# Patient Record
Sex: Female | Born: 1970 | Race: Black or African American | Hispanic: No | Marital: Single | Smoking: Never smoker
Health system: Southern US, Community
[De-identification: ages and names within clinical notes are randomized; demographics above are authoritative.]

## PROBLEM LIST (undated history)

## (undated) DIAGNOSIS — G43909 Migraine, unspecified, not intractable, without status migrainosus: Secondary | ICD-10-CM

## (undated) DIAGNOSIS — E119 Type 2 diabetes mellitus without complications: Secondary | ICD-10-CM

## (undated) DIAGNOSIS — I10 Essential (primary) hypertension: Secondary | ICD-10-CM

## (undated) HISTORY — PX: OTHER SURGICAL HISTORY: SHX169

## (undated) HISTORY — PX: CERVICAL FUSION: SHX112

## (undated) HISTORY — PX: CHOLECYSTECTOMY: SHX55

---

## 2018-11-10 HISTORY — PX: CERVICAL FUSION: SHX112

## 2020-10-18 ENCOUNTER — Encounter (HOSPITAL_COMMUNITY): Payer: Self-pay | Admitting: Emergency Medicine

## 2020-10-18 ENCOUNTER — Ambulatory Visit (HOSPITAL_COMMUNITY)
Admission: EM | Admit: 2020-10-18 | Discharge: 2020-10-18 | Disposition: A | Discharge status: Home / Self Care | Payer: 59

## 2020-10-18 ENCOUNTER — Other Ambulatory Visit: Payer: Self-pay

## 2020-10-18 DIAGNOSIS — N760 Acute vaginitis: Secondary | ICD-10-CM

## 2020-10-18 HISTORY — DX: Essential (primary) hypertension: I10

## 2020-10-18 MED ORDER — FLUCONAZOLE 150 MG PO TABS: 150.0000 mg | ORAL_TABLET | Freq: Every day | ORAL | 0 refills | Status: DC

## 2020-10-18 NOTE — ED Triage Notes (Signed)
Pt presents with possible yeast infection. C/o vaginal itching, denies discharge, dysuria, or urinary frequency.

## 2020-10-18 NOTE — Discharge Instructions (Addendum)
Diflucan as prescribed for yeast infection.  You can take 1 tab now and then 1 tab in 3 days if still having symptoms. Follow up as needed for continued or worsening symptoms

## 2020-10-19 NOTE — ED Provider Notes (Signed)
Renaldo Fiddler    CSN: [id number] Arrival date & time: 10/18/20  1221      History   Chief Complaint Chief Complaint  Patient presents with  . Vaginitis    HPI Orville Widmann is a 49 y.o. female.   Patient is a 49 year old female presents today with vaginal itching, irritation with thick discharge.  Symptoms have been constant for the past couple days.  Concern for yeast infection.  History of similar.  Denies any urinary symptoms to include dysuria, hematuria or urinary frequency.  Denies abdominal pain or fevers.     Past Medical History:  Diagnosis Date  . Hypertension     There are no problems to display for this patient.   Past Surgical History:  Procedure Laterality Date  . CERVICAL FUSION    . gallstone surgery      OB History   No obstetric history on file.      Home Medications    Prior to Admission medications   Medication Sig Start Date End Date Taking? Authorizing Provider  lisinopril-hydrochlorothiazide (ZESTORETIC) 20-25 MG tablet Take by mouth. 12/06/19  Yes [provider]  fluconazole (DIFLUCAN) 150 MG tablet Take 1 tablet (150 mg total) by mouth daily. 10/18/20   Janace Aris, NP    Family History Family History  Problem Relation Age of Onset  . Cancer Mother        Cervical     Social History Social History   Tobacco Use  . Smoking status: Never Smoker  . Smokeless tobacco: Never Used  Substance Use Topics  . Alcohol use: Yes  . Drug use: Never     Allergies   Patient has no allergy information on record.   Review of Systems Review of Systems   Physical Exam Triage Vital Signs ED Triage Vitals  Enc Vitals Group     BP 10/18/20 1243 129/79     Pulse Rate 10/18/20 1243 88     Resp 10/18/20 1243 17     Temp 10/18/20 1243 99 F (37.2 C)     Temp Source 10/18/20 1243 Oral     SpO2 10/18/20 1243 97 %     Weight --      Height --      Head Circumference --      Peak Flow --      Pain Score 10/18/20  1239 0     Pain Loc --      Pain Edu? --      Excl. in GC? --    No data found.  Updated Vital Signs BP 129/79 (BP Location: Right Arm)   Pulse 88   Temp 99 F (37.2 C) (Oral)   Resp 17   LMP  (Within Weeks)   SpO2 97%   Visual Acuity Right Eye Distance:   Left Eye Distance:   Bilateral Distance:    Right Eye Near:   Left Eye Near:    Bilateral Near:     Physical Exam Vitals and nursing note reviewed.  Constitutional:      General: She is not in acute distress.    Appearance: Normal appearance. She is not ill-appearing, toxic-appearing or diaphoretic.  HENT:     Head: Normocephalic.     Nose: Nose normal.  Eyes:     Conjunctiva/sclera: Conjunctivae normal.  Pulmonary:     Effort: Pulmonary effort is normal.  Musculoskeletal:        General: Normal range of motion.  Cervical back: Normal range of motion.  Skin:    General: Skin is warm and dry.     Findings: No rash.  Neurological:     Mental Status: She is alert.  Psychiatric:        Mood and Affect: Mood normal.      UC Treatments / Results  Labs (all labs ordered are listed, but only abnormal results are displayed) Labs Reviewed - No data to display  EKG   Radiology No results found.  Procedures Procedures (including critical care time)  Medications Ordered in UC Medications - No data to display  Initial Impression / Assessment and Plan / UC Course  I have reviewed the triage vital signs and the nursing notes.  Pertinent labs & imaging results that were available during my care of the patient were reviewed by me and considered in my medical decision making (see chart for details).     Vaginitis Diflucan as prescribed for yeast infection. Follow up as needed for continued or worsening symptoms  Final Clinical Impressions(s) / UC Diagnoses   Final diagnoses:  Vaginitis and vulvovaginitis     Discharge Instructions     Diflucan as prescribed for yeast infection.  You can take 1  tab now and then 1 tab in 3 days if still having symptoms. Follow up as needed for continued or worsening symptoms     ED Prescriptions    Medication Sig Dispense Auth. Provider   fluconazole (DIFLUCAN) 150 MG tablet Take 1 tablet (150 mg total) by mouth daily. 2 tablet Dahlia Byes A, NP     PDMP not reviewed this encounter.   Janace Aris, NP 10/19/20 [phone]

## 2021-03-01 ENCOUNTER — Other Ambulatory Visit: Payer: Self-pay

## 2021-03-01 ENCOUNTER — Emergency Department (HOSPITAL_COMMUNITY)
Admission: EM | Admit: 2021-03-01 | Discharge: 2021-03-02 | Disposition: A | Discharge status: Home / Self Care | Payer: 59 | Attending: Emergency Medicine | Admitting: Emergency Medicine

## 2021-03-01 ENCOUNTER — Encounter (HOSPITAL_COMMUNITY): Payer: Self-pay

## 2021-03-01 ENCOUNTER — Emergency Department (HOSPITAL_COMMUNITY): Payer: 59

## 2021-03-01 ENCOUNTER — Encounter (HOSPITAL_COMMUNITY): Payer: Self-pay | Admitting: Emergency Medicine

## 2021-03-01 ENCOUNTER — Ambulatory Visit (HOSPITAL_COMMUNITY)
Admission: EM | Admit: 2021-03-01 | Discharge: 2021-03-01 | Disposition: A | Discharge status: Home / Self Care | Payer: 59

## 2021-03-01 DIAGNOSIS — R9431 Abnormal electrocardiogram [ECG] [EKG]: Secondary | ICD-10-CM

## 2021-03-01 DIAGNOSIS — R002 Palpitations: Secondary | ICD-10-CM

## 2021-03-01 DIAGNOSIS — Z79899 Other long term (current) drug therapy: Secondary | ICD-10-CM | POA: Diagnosis not present

## 2021-03-01 DIAGNOSIS — R531 Weakness: Secondary | ICD-10-CM | POA: Insufficient documentation

## 2021-03-01 DIAGNOSIS — I10 Essential (primary) hypertension: Secondary | ICD-10-CM | POA: Diagnosis not present

## 2021-03-01 LAB — CBC WITH DIFFERENTIAL/PLATELET
Abs Immature Granulocytes: 0.04 10*3/uL (ref 0.00–0.07)
Basophils Absolute: 0.1 10*3/uL (ref 0.0–0.1)
Basophils Relative: 1 %
Eosinophils Absolute: 0.3 10*3/uL (ref 0.0–0.5)
Eosinophils Relative: 3 %
HCT: 35.1 % — ABNORMAL LOW (ref 36.0–46.0)
Hemoglobin: 11.1 g/dL — ABNORMAL LOW (ref 12.0–15.0)
Immature Granulocytes: 0 %
Lymphocytes Relative: 29 %
Lymphs Abs: 2.8 10*3/uL (ref 0.7–4.0)
MCH: 25.8 pg — ABNORMAL LOW (ref 26.0–34.0)
MCHC: 31.6 g/dL (ref 30.0–36.0)
MCV: 81.6 fL (ref 80.0–100.0)
Monocytes Absolute: 0.8 10*3/uL (ref 0.1–1.0)
Monocytes Relative: 8 %
Neutro Abs: 5.7 10*3/uL (ref 1.7–7.7)
Neutrophils Relative %: 59 %
Platelets: 395 10*3/uL (ref 150–400)
RBC: 4.3 MIL/uL (ref 3.87–5.11)
RDW: 14.9 % (ref 11.5–15.5)
WBC: 9.6 10*3/uL (ref 4.0–10.5)
nRBC: 0 % (ref 0.0–0.2)

## 2021-03-01 LAB — COMPREHENSIVE METABOLIC PANEL
ALT: 16 U/L (ref 0–44)
AST: 15 U/L (ref 15–41)
Albumin: 4 g/dL (ref 3.5–5.0)
Alkaline Phosphatase: 72 U/L (ref 38–126)
Anion gap: 10 (ref 5–15)
BUN: 15 mg/dL (ref 6–20)
CO2: 23 mmol/L (ref 22–32)
Calcium: 9.4 mg/dL (ref 8.9–10.3)
Chloride: 101 mmol/L (ref 98–111)
Creatinine, Ser: 0.83 mg/dL (ref 0.44–1.00)
GFR, Estimated: >60 mL/min (ref >60)
Glucose, Bld: 85 mg/dL (ref 70–99)
Potassium: 3.5 mmol/L (ref 3.5–5.1)
Sodium: 134 mmol/L — ABNORMAL LOW (ref 135–145)
Total Bilirubin: 0.5 mg/dL (ref 0.3–1.2)
Total Protein: 7.6 g/dL (ref 6.5–8.1)

## 2021-03-01 LAB — TROPONIN I (HIGH SENSITIVITY)
Troponin I (High Sensitivity): <2 ng/L (ref <18)
Troponin I (High Sensitivity): 2 ng/L (ref <18)

## 2021-03-01 NOTE — ED Triage Notes (Signed)
Patient sent from [facility] for intermittent palpitations, denies chest pain or shortness of breath, also with head pressure, denies weakness

## 2021-03-01 NOTE — ED Provider Notes (Signed)
[facility]    CSN: [id number] Arrival date & time: 03/01/21  1835      History   Chief Complaint Chief Complaint  Patient presents with  . Palpitations  . Headache    HPI Theresa Wood is a 50 y.o. female.   HPI  Palpitations: Patient states that around 2 PM today she started having intermittent palpitations while sitting at her work office.  She then began to feel her heart racing and she started having a headache.  She has not had any chest pains or shortness of breath.  No changes in medication.  No changes in caffeine intake and she denies any cocaine use.  She has not taken anything for symptoms.  Of note she was recently worked up by cardiology about 2 months ago for nonspecific chest pains where she reports that she had a normal stress test and echo.   Past Medical History:  Diagnosis Date  . Hypertension     There are no problems to display for this patient.   Past Surgical History:  Procedure Laterality Date  . CERVICAL FUSION    . gallstone surgery      OB History   No obstetric history on file.      Home Medications    Prior to Admission medications   Medication Sig Start Date End Date Taking? Authorizing Provider  lisinopril-hydrochlorothiazide (ZESTORETIC) 20-25 MG tablet Take by mouth. 12/06/19  Yes [provider]  naproxen (NAPROSYN) 500 MG tablet Take 500 mg by mouth 2 (two) times daily. 02/21/21  Yes [provider]    Family History Family History  Problem Relation Age of Onset  . Cancer Mother        Cervical     Social History Social History   Tobacco Use  . Smoking status: Never Smoker  . Smokeless tobacco: Never Used  Vaping Use  . Vaping Use: Never used  Substance Use Topics  . Alcohol use: Yes  . Drug use: Never     Allergies   Patient has no known allergies.   Review of Systems Review of Systems  As stated above in HPI Physical Exam Triage Vital Signs ED Triage Vitals  Enc Vitals  Group     BP 03/01/21 1901 (!) 155/66     Pulse Rate 03/01/21 1901 73     Resp 03/01/21 1901 20     Temp 03/01/21 1901 98.7 F (37.1 C)     Temp Source 03/01/21 1901 Oral     SpO2 03/01/21 1901 100 %     Weight 03/01/21 1903 230 lb (104.3 kg)     Height 03/01/21 1903 5\' 3"  (1.6 m)     Head Circumference --      Peak Flow --      Pain Score 03/01/21 1903 0     Pain Loc --      Pain Edu? --      Excl. in GC? --    No data found.  Updated Vital Signs BP (!) 155/66 (BP Location: Right Arm)   Pulse 73 Comment: regular  Temp 98.7 F (37.1 C) (Oral)   Resp 20   Ht 5\' 3"  (1.6 m)   Wt 230 lb (104.3 kg)   LMP 02/22/2021 (Exact Date)   SpO2 100%   BMI 40.74 kg/m   Physical Exam Vitals and nursing note reviewed.  Constitutional:      General: She is not in acute distress.    Appearance: She is  well-developed. She is obese. She is not ill-appearing, toxic-appearing or diaphoretic.  Cardiovascular:     Rate and Rhythm: Normal rate. Rhythm irregular.     Pulses:          Carotid pulses are 2+ on the right side and 2+ on the left side.    Heart sounds: Normal heart sounds.  Pulmonary:     Effort: Pulmonary effort is normal.     Breath sounds: Normal breath sounds.  Musculoskeletal:     Right lower leg: No edema.     Left lower leg: No edema.  Neurological:     Mental Status: She is alert.      UC Treatments / Results  Labs (all labs ordered are listed, but only abnormal results are displayed) Labs Reviewed - No data to display  EKG   Radiology No results found.  Procedures Procedures (including critical care time)  Medications Ordered in UC Medications - No data to display  Initial Impression / Assessment and Plan / UC Course  I have reviewed the triage vital signs and the nursing notes.  Pertinent labs & imaging results that were available during my care of the patient were reviewed by me and considered in my medical decision making (see chart for  details).     New.  Abnormal EKG which is new finding along with active palpitations.  I discussed with patient that she will need lab work and cardiac monitoring and then emergency department.  I have recommended EMS transport however she has declined due to financial reasons at this time despite my advisement.  As she is not having any active chest pain she would like to travel across the parking lot via her vehicle to the emergency room.   Final Clinical Impressions(s) / UC Diagnoses   Final diagnoses:  Heart palpitations  Nonspecific abnormal electrocardiogram (ECG) (EKG)     Discharge Instructions     I recommend EMS transportation to the Emergency Room   ED Prescriptions    None     PDMP not reviewed this encounter.   Theresa Wood, [place] 03/01/21 1930

## 2021-03-01 NOTE — Discharge Instructions (Addendum)
I recommend EMS transportation to the Emergency Room

## 2021-03-01 NOTE — ED Triage Notes (Signed)
Pt presents today with c/o of intermittent palpitations that began this afternoon while sitting in office. She said she felt her heart beat race and she began to have a headache. Denies chest pain or SOB.

## 2021-03-01 NOTE — ED Triage Notes (Addendum)
Emergency Medicine Provider Triage Evaluation Note  Theresa Wood , a 50 y.o. female  was evaluated in triage.  Pt complains of intermittent heart palpitations pressure in her head.  Patient reports that her palpitations began approximately 1400 while at work.  Palpitations are intermittent and come on randomly.  Patient denies any alleviating or aggravating factors.  Patient reports that after an episode of palpitations she begins to feel pressure in her head.  Patient denies any chest pain, shortness of breath, headache, slurred speech, facial asymmetry, focal neurological deficit.  Patient was sent here from urgent care due to her palpitations.    Review of Systems  Positive: Symptoms, head pressure Negative: Chest pain, shortness of breath, headache, slurred speech, facial asymmetry, focal neurological deficit, neck pain  Physical Exam  BP 140/64 (BP Location: Right Arm)   Pulse 78   Temp 98.6 F (37 C) (Oral)   Resp 20   Ht 5\' 3"  (1.6 m)   Wt 104.3 kg   LMP 02/22/2021 (Exact Date)   SpO2 100%   BMI 40.73 kg/m  Gen:   Awake, no distress   HEENT:  Atraumatic, EOM Intact,  Resp:  Normal effort, lungs clear to auscultation Cardiac:  Normal rate was +3 carotid pulse positive +3 radial pulse bilateraly MSK:   Moves extremities without difficulty  Neuro:  Speech clear, no significant  Medical Decision Making  Medically screening exam initiated at 7:51 PM.  Appropriate orders placed.  Laurie Penado was informed that the remainder of the evaluation will be completed by another provider, this initial triage assessment does not replace that evaluation, and the importance of remaining in the ED until their evaluation is complete.  Clinical Impression   The patient appears stable so that the remainder of the work up may be completed by another provider.      Merril Abbe, PA-C 03/01/21 1955    03/03/21, PA-C 03/01/21 [phone]

## 2021-03-01 NOTE — ED Notes (Addendum)
Patient is being discharged from the Urgent Care and sent to the Emergency Department via POV . Per Clent Jacks, PA , patient is in need of higher level of care due to heart palpitations. Patient is aware and verbalizes understanding of plan of care.  Vitals:   03/01/21 1901  BP: (!) 155/66  Pulse: 73  Resp: 20  Temp: 98.7 F (37.1 C)  SpO2: 100%

## 2021-03-02 NOTE — ED Provider Notes (Signed)
[facility] EMERGENCY DEPARTMENT Provider Note   CSN: [id number] Arrival date & time: 03/01/21  1937     History Chief Complaint  Patient presents with  . Palpitations    Theresa Wood is a 50 y.o. female.  Patient to ED for evaluation of multiple brief episodes of fast-rate palpitations that started today around 2:00 pm. It lasts for a few seconds and resolves and has been recurring regularly since onset. No chest pain, SOB. She reports feeling generally weak today. No fever. No history of similar symptoms. No increased caffeine use today. No new medications.   The history is provided by the patient. No language interpreter was used.  Palpitations Associated symptoms: weakness   Associated symptoms: no chest pain and no shortness of breath        Past Medical History:  Diagnosis Date  . Hypertension     There are no problems to display for this patient.   Past Surgical History:  Procedure Laterality Date  . CERVICAL FUSION    . gallstone surgery       OB History   No obstetric history on file.     Family History  Problem Relation Age of Onset  . Cancer Mother        Cervical     Social History   Tobacco Use  . Smoking status: Never Smoker  . Smokeless tobacco: Never Used  Vaping Use  . Vaping Use: Never used  Substance Use Topics  . Alcohol use: Yes  . Drug use: Never    Home Medications Prior to Admission medications   Medication Sig Start Date End Date Taking? Authorizing Provider  lisinopril-hydrochlorothiazide (ZESTORETIC) 20-25 MG tablet Take by mouth. 12/06/19   [provider]  naproxen (NAPROSYN) 500 MG tablet Take 500 mg by mouth 2 (two) times daily. 02/21/21   [provider]    Allergies    Patient has no known allergies.  Review of Systems   Review of Systems  Constitutional: Negative for chills and fever.  HENT: Negative.   Respiratory: Negative.  Negative for shortness of breath.    Cardiovascular: Positive for palpitations. Negative for chest pain.  Gastrointestinal: Negative.   Musculoskeletal: Negative.   Skin: Negative.   Neurological: Positive for weakness.    Physical Exam Updated Vital Signs BP (!) 134/111 (BP Location: Right Arm)   Pulse 73   Temp 97.9 F (36.6 C) (Oral)   Resp 13   Ht 5\' 3"  (1.6 m)   Wt 104.3 kg   LMP 02/22/2021 (Exact Date)   SpO2 100%   BMI 40.73 kg/m   Physical Exam Vitals and nursing note reviewed.  Constitutional:      Appearance: She is well-developed.  HENT:     Head: Normocephalic.  Neck:     Vascular: No carotid bruit.  Cardiovascular:     Rate and Rhythm: Normal rate and regular rhythm.  Pulmonary:     Effort: Pulmonary effort is normal.     Breath sounds: Normal breath sounds.  Abdominal:     General: Bowel sounds are normal.     Palpations: Abdomen is soft.     Tenderness: There is no abdominal tenderness. There is no guarding or rebound.  Musculoskeletal:        General: Normal range of motion.     Cervical back: Normal range of motion and neck supple.     Right lower leg: No edema.     Left lower leg: No edema.  Skin:    General: Skin is warm and dry.  Neurological:     General: No focal deficit present.     Mental Status: She is alert and oriented to person, place, and time.     ED Results / Procedures / Treatments   Labs (all labs ordered are listed, but only abnormal results are displayed) Labs Reviewed  CBC WITH DIFFERENTIAL/PLATELET - Abnormal; Notable for the following components:      Result Value   Hemoglobin 11.1 (*)    HCT 35.1 (*)    MCH 25.8 (*)    All other components within normal limits  COMPREHENSIVE METABOLIC PANEL - Abnormal; Notable for the following components:   Sodium 134 (*)    All other components within normal limits  TROPONIN I (HIGH SENSITIVITY)  TROPONIN I (HIGH SENSITIVITY)   Results for orders placed or performed during the hospital encounter of 03/01/21   CBC with Differential  Result Value Ref Range   WBC 9.6 4.0 - 10.5 K/uL   RBC 4.30 3.87 - 5.11 MIL/uL   Hemoglobin 11.1 (L) 12.0 - 15.0 g/dL   HCT 16.0 (L) 10.9 - 32.3 %   MCV 81.6 80.0 - 100.0 fL   MCH 25.8 (L) 26.0 - 34.0 pg   MCHC 31.6 30.0 - 36.0 g/dL   RDW 55.7 32.2 - 02.5 %   Platelets 395 150 - 400 K/uL   nRBC 0.0 0.0 - 0.2 %   Neutrophils Relative % 59 %   Neutro Abs 5.7 1.7 - 7.7 K/uL   Lymphocytes Relative 29 %   Lymphs Abs 2.8 0.7 - 4.0 K/uL   Monocytes Relative 8 %   Monocytes Absolute 0.8 0.1 - 1.0 K/uL   Eosinophils Relative 3 %   Eosinophils Absolute 0.3 0.0 - 0.5 K/uL   Basophils Relative 1 %   Basophils Absolute 0.1 0.0 - 0.1 K/uL   Immature Granulocytes 0 %   Abs Immature Granulocytes 0.04 0.00 - 0.07 K/uL  Comprehensive metabolic panel  Result Value Ref Range   Sodium 134 (L) 135 - 145 mmol/L   Potassium 3.5 3.5 - 5.1 mmol/L   Chloride 101 98 - 111 mmol/L   CO2 23 22 - 32 mmol/L   Glucose, Bld 85 70 - 99 mg/dL   BUN 15 6 - 20 mg/dL   Creatinine, Ser 4.27 0.44 - 1.00 mg/dL   Calcium 9.4 8.9 - 06.2 mg/dL   Total Protein 7.6 6.5 - 8.1 g/dL   Albumin 4.0 3.5 - 5.0 g/dL   AST 15 15 - 41 U/L   ALT 16 0 - 44 U/L   Alkaline Phosphatase 72 38 - 126 U/L   Total Bilirubin 0.5 0.3 - 1.2 mg/dL   GFR, Estimated >37 >62 mL/min   Anion gap 10 5 - 15  Troponin I (High Sensitivity)  Result Value Ref Range   Troponin I (High Sensitivity) <2 <18 ng/L  Troponin I (High Sensitivity)  Result Value Ref Range   Troponin I (High Sensitivity) 2 <18 ng/L    EKG EKG Interpretation  Date/Time:  Friday March 01 2021 19:49:15 EDT Ventricular Rate:  72 PR Interval:  172 QRS Duration: 68 QT Interval:  362 QTC Calculation: 396 R Axis:   62 Text Interpretation: Sinus rhythm with Premature atrial complexes Confirmed by Palumbo, April ([id number]) on 03/02/2021 2:23:15 AM   Radiology DG Chest 2 View  Result Date: 03/01/2021 CLINICAL DATA:  Palpitations for 1 day with  abnormal EKG. EXAM: CHEST -  2 VIEW COMPARISON:  None. FINDINGS: Heart size and pulmonary vascularity are normal. Lungs are clear. No pleural effusions. No pneumothorax. Mediastinal contours appear intact. Thoracic scoliosis convex towards the right. Postoperative changes in the cervical spine. IMPRESSION: No active cardiopulmonary disease. Electronically Signed   By: Burman Nieves M.D.   On: 03/01/2021 20:42    Procedures Procedures   Medications Ordered in ED Medications - No data to display  ED Course  I have reviewed the triage vital signs and the nursing notes.  Pertinent labs & imaging results that were available during my care of the patient were reviewed by me and considered in my medical decision making (see chart for details).    MDM Rules/Calculators/A&P                          Patient to ED for evaluation of palpitations as detailed per HPI.   She is very well appearing and in NAD. EKG is NSR, PAC present but no arrhythmia. Labs are reassuring.   She is felt appropriate for discharge home with cardiology referral for consideration of further testing. The patient is comfortable with plan of discharge and outpatient follow up.   Final Clinical Impression(s) / ED Diagnoses Final diagnoses:  Palpitations    Rx / DC Orders ED Discharge Orders    None       Danne Harbor 03/02/21 0237    Palumbo, April, MD 03/02/21 [phone]

## 2021-03-02 NOTE — Discharge Instructions (Addendum)
Please return to the ED with any worsening symptoms or new concerns.   Follow up with cardiology for consideration of further outpatient testing for palpitations.

## 2021-04-18 NOTE — Progress Notes (Incomplete)
Cardiology Office Note:    Date:  04/18/2021   ID:  Theresa Wood, DOB 11-Jan-1971, MRN [id number]  PCP:  System, Provider Not In  Cardiologist:  None  Referring MD: Elpidio Anis, PA-C   No chief complaint on file.   History of Present Illness:    Theresa Wood is a 50 y.o. female with a hx of hypertension *** who is seen as a new consult at the request of Upstill, Shari, PA-C for the evaluation and management of palpitations ***. She was recently in the ED 03/01/2021 for evaluation of multiple brief episodes of palpitations. She was referred to cardiology for consideration of further testing.  Today:  Cardiovascular risk factors: Prior clinical ASCVD:  Comorbid conditions, including hypertension, hyperlipidemia, diabetes, chronic kidney disease:  Metabolic syndrome/Obesity: Chronic inflammatory conditions: Tobacco use history: Family history: Prior cardiac testing and/or incidental findings on other testing (ie coronary calcium): Exercise level: Current diet:   She denies any chest pain, shortness of breath, palpitations, or exertional symptoms. No headaches, lightheadedness, or syncope to report. Also has no lower extremity edema, orthopnea or PND.   Past Medical History:  Diagnosis Date   Hypertension     Past Surgical History:  Procedure Laterality Date   CERVICAL FUSION     gallstone surgery      Current Medications: Current Outpatient Medications on File Prior to Visit  Medication Sig   lisinopril-hydrochlorothiazide (ZESTORETIC) 20-25 MG tablet Take by mouth.   naproxen (NAPROSYN) 500 MG tablet Take 500 mg by mouth 2 (two) times daily.   No current facility-administered medications on file prior to visit.     Allergies:   Patient has no known allergies.   Social History   Tobacco Use   Smoking status: Never   Smokeless tobacco: Never  Vaping Use   Vaping Use: Never used  Substance Use Topics   Alcohol use: Yes   Drug use: Never    Family  History: family history includes Cancer in her mother.  ROS:   Please see the history of present illness.  Additional pertinent ROS: Constitutional: Negative for chills, fever, night sweats, unintentional weight loss  HENT: Negative for ear pain and hearing loss.   Eyes: Negative for loss of vision and eye pain.  Respiratory: Negative for cough, sputum, wheezing.   Cardiovascular: See HPI. Gastrointestinal: Negative for abdominal pain, melena, and hematochezia.  Genitourinary: Negative for dysuria and hematuria.  Musculoskeletal: Negative for falls and myalgias.  Skin: Negative for itching and rash.  Neurological: Negative for focal weakness, focal sensory changes and loss of consciousness.  Endo/Heme/Allergies: Does not bruise/bleed easily.     EKGs/Labs/Other Studies Reviewed:    The following studies were reviewed today: No previous cardiac studies available.  EKG:  EKG is personally reviewed.   04/19/2021: *** 03/01/2021 (ED): Sinus rhythm with Premature atrial complexes, rate 72   Recent Labs: 03/01/2021: ALT 16; BUN 15; Creatinine, Ser 0.83; Hemoglobin 11.1; Platelets 395; Potassium 3.5; Sodium 134  Recent Lipid Panel No results found for: CHOL, TRIG, HDL, CHOLHDL, VLDL, LDLCALC, LDLDIRECT  Physical Exam:    VS:  There were no vitals taken for this visit.    Wt Readings from Last 3 Encounters:  03/01/21 229 lb 15 oz (104.3 kg)  03/01/21 230 lb (104.3 kg)    GEN: Well nourished, well developed in no acute distress HEENT: Normal, moist mucous membranes NECK: No JVD CARDIAC: regular rhythm, normal S1 and S2, no rubs or gallops. No murmur. VASCULAR: Radial and DP pulses  2+ bilaterally. No carotid bruits RESPIRATORY:  Clear to auscultation without rales, wheezing or rhonchi  ABDOMEN: Soft, non-tender, non-distended MUSCULOSKELETAL:  Ambulates independently SKIN: Warm and dry, no edema NEUROLOGIC:  Alert and oriented x 3. No focal neuro deficits noted. PSYCHIATRIC:   Normal affect    ASSESSMENT:    No diagnosis found. PLAN:     Cardiac risk counseling and prevention recommendations: -recommend heart healthy/Mediterranean diet, with whole grains, fruits, vegetable, fish, lean meats, nuts, and olive oil. Limit salt. -recommend moderate walking, 3-5 times/week for 30-50 minutes each session. Aim for at least 150 minutes.week. Goal should be pace of 3 miles/hours, or walking 1.5 miles in 30 minutes -recommend avoidance of tobacco products. Avoid excess alcohol. -ASCVD risk score: The 10-year ASCVD risk score Denman George DC Montez Hageman., et al., 2013) is: 5%   Values used to calculate the score:     Age: 60 years     Sex: Female     Is Non-Hispanic African American: Yes     Diabetic: No     Tobacco smoker: No     Systolic Blood Pressure: 122 mmHg     Is BP treated: Yes     HDL Cholesterol: 41 mg/dL     Total Cholesterol: 223 mg/dL    Plan for follow up: *** months or sooner as needed.  Jodelle Red, MD, PhD, [facility]   [facility]    Medication Adjustments/Labs and Tests Ordered: Current medicines are reviewed at length with the patient today.  Concerns regarding medicines are outlined above.  No orders of the defined types were placed in this encounter.  No orders of the defined types were placed in this encounter.   There are no Patient Instructions on file for this visit.   I,Mathew Stumpf,acting as a Neurosurgeon for [employer], MD.,have documented all relevant documentation on the behalf of Jodelle Red, MD,as directed by  Jodelle Red, MD while in the presence of Jodelle Red, MD.  ***  Signed, Jodelle Red, MD PhD 04/18/2021 9:10 AM    [facility]

## 2021-04-19 ENCOUNTER — Ambulatory Visit: Payer: 59 | Admitting: Cardiology

## 2021-07-01 NOTE — Progress Notes (Deleted)
    Cardiology Office Note   Date:  07/01/2021   ID:  Theresa Wood, DOB 29-Aug-1971, MRN [id number]  PCP:  System, Provider Not In  Cardiologist:   Erron Wengert [place], MD   No chief complaint on file.     History of Present Illness: Theresa Wood is a 50 y.o. female who is seen at the request of Dr April Palumbo for evaluation of palpitations. She  has a history of HTN. She was seen in the ED on 03/01/21 for evaluation of palpitations. She was mildly anemic. Ecg showed PACs otherwise normal.     Past Medical History:  Diagnosis Date   Hypertension     Past Surgical History:  Procedure Laterality Date   CERVICAL FUSION     gallstone surgery       Current Outpatient Medications  Medication Sig Dispense Refill   lisinopril-hydrochlorothiazide (ZESTORETIC) 20-25 MG tablet Take by mouth.     naproxen (NAPROSYN) 500 MG tablet Take 500 mg by mouth 2 (two) times daily.     No current facility-administered medications for this visit.    Allergies:   Patient has no known allergies.    Social History:  The patient  reports that she has never smoked. She has never used smokeless tobacco. She reports current alcohol use. She reports that she does not use drugs.   Family History:  The patient's ***family history includes Cancer in her mother.    ROS:  Please see the history of present illness.   Otherwise, review of systems are positive for {NONE DEFAULTED:18576}.   All other systems are reviewed and negative.    PHYSICAL EXAM: VS:  There were no vitals taken for this visit. , BMI There is no height or weight on file to calculate BMI. GEN: Well nourished, well developed, in no acute distress HEENT: normal Neck: no JVD, carotid bruits, or masses Cardiac: ***RRR; no murmurs, rubs, or gallops,no edema  Respiratory:  clear to auscultation bilaterally, normal work of breathing GI: soft, nontender, nondistended, + BS MS: no deformity or atrophy Skin: warm and dry, no rash Neuro:   Strength and sensation are intact Psych: euthymic mood, full affect   EKG:  EKG {ACTION; IS/IS [id number]} ordered today. The ekg ordered today demonstrates ***   Recent Labs: 03/01/2021: ALT 16; BUN 15; Creatinine, Ser 0.83; Hemoglobin 11.1; Platelets 395; Potassium 3.5; Sodium 134    Lipid Panel No results found for: CHOL, TRIG, HDL, CHOLHDL, VLDL, LDLCALC, LDLDIRECT    Wt Readings from Last 3 Encounters:  03/01/21 229 lb 15 oz (104.3 kg)  03/01/21 230 lb (104.3 kg)      Other studies Reviewed: Additional studies/ records that were reviewed today include: ***. Review of the above records demonstrates: ***   ASSESSMENT AND PLAN:  1.  ***   Current medicines are reviewed at length with the patient today.  The patient {ACTIONS; HAS/DOES NOT HAVE:19233} concerns regarding medicines.  The following changes have been made:  {PLAN; NO CHANGE:13088:s}  Labs/ tests ordered today include: *** No orders of the defined types were placed in this encounter.    Disposition:   FU with *** in {gen number [id number]} {Days to years:10300}  Signed, Vendela Troung [place], MD  07/01/2021 7:19 AM    [facility] [address], [place], [place], [address] Phone [phone], Fax [phone]

## 2021-07-05 ENCOUNTER — Ambulatory Visit: Payer: 59 | Admitting: Cardiology

## 2021-12-17 ENCOUNTER — Ambulatory Visit (INDEPENDENT_AMBULATORY_CARE_PROVIDER_SITE_OTHER): Payer: 59

## 2021-12-17 ENCOUNTER — Encounter (HOSPITAL_COMMUNITY): Payer: Self-pay

## 2021-12-17 ENCOUNTER — Ambulatory Visit (HOSPITAL_COMMUNITY)
Admission: EM | Admit: 2021-12-17 | Discharge: 2021-12-17 | Disposition: A | Discharge status: Home / Self Care | Payer: 59 | Attending: Physician Assistant | Admitting: Physician Assistant

## 2021-12-17 ENCOUNTER — Other Ambulatory Visit: Payer: Self-pay

## 2021-12-17 DIAGNOSIS — M62838 Other muscle spasm: Secondary | ICD-10-CM

## 2021-12-17 DIAGNOSIS — M542 Cervicalgia: Secondary | ICD-10-CM

## 2021-12-17 MED ORDER — PREDNISONE 20 MG PO TABS: 40.0000 mg | ORAL_TABLET | Freq: Every day | ORAL | 0 refills | Status: AC

## 2021-12-17 MED ORDER — METHOCARBAMOL 500 MG PO TABS: 500.0000 mg | ORAL_TABLET | Freq: Three times a day (TID) | ORAL | 0 refills | Status: DC | PRN

## 2021-12-17 MED ORDER — PREDNISONE 20 MG PO TABS: 40.0000 mg | ORAL_TABLET | Freq: Every day | ORAL | 0 refills | Status: DC

## 2021-12-17 NOTE — ED Triage Notes (Signed)
Pt c/o headaches x2wks. States hx of rt neck and shoulder pain for over a month. States hx of lt shoulder surgery for the same issues. Denies new injury.

## 2021-12-17 NOTE — Discharge Instructions (Signed)
Your x-ray showed surgical changes as well as some additional degeneration.  I believe that this is contributing to your symptoms as well as a muscle strain.  Please start prednisone 40 mg for 4 days to help with pain and inflammation.  Do not take NSAIDs including aspirin, ibuprofen/Advil, naproxen/Aleve with this medication as it can cause stomach bleeding.  Take Robaxin up to 3 times a day as needed for muscle pain.  This can make you sleepy so do not drive or drink alcohol with taking it.  I do think you would benefit from physical therapy so please follow-up with orthopedics.  You may also need an MRI in the future if your symptoms are not improving quickly.  If you have any worsening symptoms including severe pain, severe headache, weakness or numbness in your arms, tingling sensation in your hands you need to be seen immediately.

## 2021-12-17 NOTE — ED Provider Notes (Signed)
[facility]    CSN: [id number] Arrival date & time: 12/17/21  1727      History   Chief Complaint Chief Complaint  Patient presents with   Headache    HPI Theresa Wood is a 51 y.o. female.   Patient presents today with a 2-week history of worsening right-sided neck/shoulder/head pain.  She denies any known injury or increase in activity prior to symptom onset; has been exercising more frequently with weights but does not believe she injured herself prior to symptoms beginning.  Pain is rated 7 on a 0-10 pain scale, localized to cervical spine and right paraspinal muscles with radiation along trapezius and into axilla, described as aching, no aggravating or alleviating factors identified.  She denies any weakness, numbness, paresthesias.  She has had cervical fusion in 2020 and is anxious that symptoms could be related to cervical spine injury.  She is left-handed but uses her right hand for many things.  She has been using naproxen and previous prescription of cyclobenzaprine without improvement of symptoms.     Past Medical History:  Diagnosis Date   Hypertension     There are no problems to display for this patient.   Past Surgical History:  Procedure Laterality Date   CERVICAL FUSION     gallstone surgery      OB History   No obstetric history on file.      Home Medications    Prior to Admission medications   Medication Sig Start Date End Date Taking? Authorizing Provider  lisinopril-hydrochlorothiazide (ZESTORETIC) 20-25 MG tablet Take by mouth. 12/06/19   [provider]  methocarbamol (ROBAXIN) 500 MG tablet Take 1 tablet (500 mg total) by mouth every 8 (eight) hours as needed for muscle spasms. 12/17/21   Luccas Towell, Derry Skill, PA-C  naproxen (NAPROSYN) 500 MG tablet Take 500 mg by mouth 2 (two) times daily. 02/21/21   [provider]  predniSONE (DELTASONE) 20 MG tablet Take 2 tablets (40 mg total) by mouth daily for 4 days. 12/17/21 12/21/21   Phillippa Straub, Derry Skill, PA-C    Family History Family History  Problem Relation Age of Onset   Cancer Mother        Cervical     Social History Social History   Tobacco Use   Smoking status: Never   Smokeless tobacco: Never  Vaping Use   Vaping Use: Never used  Substance Use Topics   Alcohol use: Yes   Drug use: Never     Allergies   Patient has no known allergies.   Review of Systems Review of Systems  Constitutional:  Positive for activity change. Negative for appetite change, fatigue and fever.  Eyes:  Negative for visual disturbance.  Respiratory:  Negative for cough and shortness of breath.   Cardiovascular:  Negative for chest pain.  Gastrointestinal:  Negative for abdominal pain, diarrhea, nausea and vomiting.  Musculoskeletal:  Positive for back pain and neck pain. Negative for arthralgias.  Neurological:  Positive for headaches. Negative for dizziness, weakness, light-headedness and numbness.    Physical Exam Triage Vital Signs ED Triage Vitals  Enc Vitals Group     BP 12/17/21 1822 123/81     Pulse Rate 12/17/21 1822 86     Resp 12/17/21 1822 18     Temp 12/17/21 1822 99.3 F (37.4 C)     Temp Source 12/17/21 1822 Oral     SpO2 12/17/21 1822 97 %     Weight --  Height --      Head Circumference --      Peak Flow --      Pain Score 12/17/21 1823 7     Pain Loc --      Pain Edu? --      Excl. in [place]? --    No data found.  Updated Vital Signs BP 123/81 (BP Location: Left Arm)    Pulse 86    Temp 99.3 F (37.4 C) (Oral)    Resp 18    SpO2 97%   Visual Acuity Right Eye Distance:   Left Eye Distance:   Bilateral Distance:    Right Eye Near:   Left Eye Near:    Bilateral Near:     Physical Exam Vitals reviewed.  Constitutional:      General: She is awake. She is not in acute distress.    Appearance: Normal appearance. She is well-developed. She is not ill-appearing.     Comments: Very pleasant female appears stated age in no acute distress  sitting comfortably in exam room  HENT:     Head: Normocephalic and atraumatic.  Cardiovascular:     Rate and Rhythm: Normal rate and regular rhythm.     Heart sounds: Normal heart sounds, S1 normal and S2 normal. No murmur heard. Pulmonary:     Effort: Pulmonary effort is normal.     Breath sounds: Normal breath sounds. No wheezing, rhonchi or rales.     Comments: Clear to auscultation bilaterally Abdominal:     Palpations: Abdomen is soft.     Tenderness: There is no abdominal tenderness.  Musculoskeletal:     Right shoulder: No swelling, tenderness or bony tenderness. Normal range of motion. Normal strength.     Cervical back: Spasms and tenderness present. No bony tenderness. Normal range of motion.     Thoracic back: No tenderness or bony tenderness.     Lumbar back: No tenderness or bony tenderness.       Back:     Comments: Neck: Tenderness palpation over right paraspinal muscles and trapezius.  No deformity or step-off noted.  No pain percussion of vertebrae.  Normal active range of motion.  Right shoulder: Strength 5/5 bilateral upper extremities.  Normal active range of motion.  Negative drop arm and empty can.  Hand neurovascularly intact.  Psychiatric:        Behavior: Behavior is cooperative.     UC Treatments / Results  Labs (all labs ordered are listed, but only abnormal results are displayed) Labs Reviewed - No data to display  EKG   Radiology DG Cervical Spine Complete  Result Date: 12/17/2021 CLINICAL DATA:  Headaches for 2 weeks. Neck and shoulder pain for over a month. Previous shoulder surgery. EXAM: CERVICAL SPINE - COMPLETE 4+ VIEW COMPARISON:  None. FINDINGS: Postoperative changes with anterior plate and screw fixation and intervertebral fusion at C5 through C7. Fused segments appear intact. Hardware appears intact. Straightening of usual cervical lordosis is likely positional but could indicate muscle spasm. No abnormal anterior subluxations. Normal  alignment of the posterior elements. C1-2 articulation appears intact. No significant bone encroachment upon the neural foramina. No vertebral compression deformities. Degenerative changes are demonstrated at C4-5 level. No prevertebral soft tissue swelling. IMPRESSION: Postoperative anterior fixation of C5 through C7. Surgical hardware in few segments appear intact. Nonspecific straightening of usual cervical lordosis. Degenerative changes at C4-5. Electronically Signed   By: Lucienne Capers M.D.   On: 12/17/2021 19:13    Procedures Procedures (  including critical care time)  Medications Ordered in UC Medications - No data to display  Initial Impression / Assessment and Plan / UC Course  I have reviewed the triage vital signs and the nursing notes.  Pertinent labs & imaging results that were available during my care of the patient were reviewed by me and considered in my medical decision making (see chart for details).     X-ray obtained given history of cervical fusion showed postsurgical changes with degeneration at C3/C4.  Discussed that symptoms are likely related to trapezius strain and she was prescribed methocarbamol to be used up to 3 times a day as needed with instruction to drive or drink alcohol while taking medication due to associated sedation.  She has failed NSAIDs and will try prednisone burst and was instructed not to take NSAIDs with this medication as it can cause GI bleeding.  Recommended gentle stretch, heat, rest for symptom relief.  Discussed that if symptoms are not improving she would likely benefit from physical therapy and/or MRI and recommended that she follow-up with orthopedics if symptoms or not improving.  She was given contact information for local provider encouraged to call to schedule an appointment.  Discussed alarm symptoms that warrant emergent evaluation including increased pain, weakness, numbness/paresthesias.  Strict return precautions given to which she  expressed understanding.  At the end of visit patient requested that medication be sent to a different pharmacy due to her typical pharmacy being closed.  Final Clinical Impressions(s) / UC Diagnoses   Final diagnoses:  Neck pain  Trapezius muscle spasm     Discharge Instructions      Your x-ray showed surgical changes as well as some additional degeneration.  I believe that this is contributing to your symptoms as well as a muscle strain.  Please start prednisone 40 mg for 4 days to help with pain and inflammation.  Do not take NSAIDs including aspirin, ibuprofen/Advil, naproxen/Aleve with this medication as it can cause stomach bleeding.  Take Robaxin up to 3 times a day as needed for muscle pain.  This can make you sleepy so do not drive or drink alcohol with taking it.  I do think you would benefit from physical therapy so please follow-up with orthopedics.  You may also need an MRI in the future if your symptoms are not improving quickly.  If you have any worsening symptoms including severe pain, severe headache, weakness or numbness in your arms, tingling sensation in your hands you need to be seen immediately.     ED Prescriptions     Medication Sig Dispense Auth. Provider   predniSONE (DELTASONE) 20 MG tablet  (Status: Discontinued) Take 2 tablets (40 mg total) by mouth daily for 4 days. 8 tablet Naylee Frankowski K, PA-C   methocarbamol (ROBAXIN) 500 MG tablet  (Status: Discontinued) Take 1 tablet (500 mg total) by mouth every 8 (eight) hours as needed for muscle spasms. 20 tablet Mersadez Linden K, PA-C   methocarbamol (ROBAXIN) 500 MG tablet Take 1 tablet (500 mg total) by mouth every 8 (eight) hours as needed for muscle spasms. 20 tablet Kalaya Infantino K, PA-C   predniSONE (DELTASONE) 20 MG tablet Take 2 tablets (40 mg total) by mouth daily for 4 days. 8 tablet Marcelles Clinard, Derry Skill, PA-C      PDMP not reviewed this encounter.   Terrilee Croak, PA-C 12/17/21 1929

## 2021-12-24 ENCOUNTER — Emergency Department (HOSPITAL_COMMUNITY)
Admission: EM | Admit: 2021-12-24 | Discharge: 2021-12-24 | Disposition: A | Discharge status: Home / Self Care | Payer: 59 | Attending: Emergency Medicine | Admitting: Emergency Medicine

## 2021-12-24 ENCOUNTER — Encounter: Payer: Self-pay | Admitting: Neurology

## 2021-12-24 ENCOUNTER — Other Ambulatory Visit: Payer: Self-pay

## 2021-12-24 ENCOUNTER — Telehealth (HOSPITAL_COMMUNITY): Payer: Self-pay | Admitting: Emergency Medicine

## 2021-12-24 ENCOUNTER — Encounter (HOSPITAL_COMMUNITY): Payer: Self-pay | Admitting: *Deleted

## 2021-12-24 ENCOUNTER — Emergency Department (HOSPITAL_COMMUNITY): Payer: 59

## 2021-12-24 DIAGNOSIS — R519 Headache, unspecified: Secondary | ICD-10-CM | POA: Diagnosis not present

## 2021-12-24 HISTORY — DX: Migraine, unspecified, not intractable, without status migrainosus: G43.909

## 2021-12-24 LAB — BASIC METABOLIC PANEL
Anion gap: 13 (ref 5–15)
BUN: 8 mg/dL (ref 6–20)
CO2: 24 mmol/L (ref 22–32)
Calcium: 9.5 mg/dL (ref 8.9–10.3)
Chloride: 96 mmol/L — ABNORMAL LOW (ref 98–111)
Creatinine, Ser: 1.03 mg/dL — ABNORMAL HIGH (ref 0.44–1.00)
GFR, Estimated: >60 mL/min (ref >60)
Glucose, Bld: 110 mg/dL — ABNORMAL HIGH (ref 70–99)
Potassium: 3.3 mmol/L — ABNORMAL LOW (ref 3.5–5.1)
Sodium: 133 mmol/L — ABNORMAL LOW (ref 135–145)

## 2021-12-24 LAB — CBC WITH DIFFERENTIAL/PLATELET
Abs Immature Granulocytes: 0.12 10*3/uL — ABNORMAL HIGH (ref 0.00–0.07)
Basophils Absolute: 0.1 10*3/uL (ref 0.0–0.1)
Basophils Relative: 1 %
Eosinophils Absolute: 0.4 10*3/uL (ref 0.0–0.5)
Eosinophils Relative: 3 %
HCT: 33.7 % — ABNORMAL LOW (ref 36.0–46.0)
Hemoglobin: 11.3 g/dL — ABNORMAL LOW (ref 12.0–15.0)
Immature Granulocytes: 1 %
Lymphocytes Relative: 29 %
Lymphs Abs: 3.2 10*3/uL (ref 0.7–4.0)
MCH: 27 pg (ref 26.0–34.0)
MCHC: 33.5 g/dL (ref 30.0–36.0)
MCV: 80.4 fL (ref 80.0–100.0)
Monocytes Absolute: 1 10*3/uL (ref 0.1–1.0)
Monocytes Relative: 10 %
Neutro Abs: 6.2 10*3/uL (ref 1.7–7.7)
Neutrophils Relative %: 56 %
Platelets: 371 10*3/uL (ref 150–400)
RBC: 4.19 MIL/uL (ref 3.87–5.11)
RDW: 14.2 % (ref 11.5–15.5)
WBC: 10.9 10*3/uL — ABNORMAL HIGH (ref 4.0–10.5)
nRBC: 0 % (ref 0.0–0.2)

## 2021-12-24 MED ORDER — IOHEXOL 350 MG/ML SOLN
75.0000 mL | Freq: Once | INTRAVENOUS | Status: AC | PRN
  Administered 2021-12-24: 75 mL via INTRAVENOUS

## 2021-12-24 MED ORDER — DIPHENHYDRAMINE HCL 50 MG/ML IJ SOLN: 25.0000 mg | Freq: Once | INTRAMUSCULAR | Status: DC

## 2021-12-24 MED ORDER — DIPHENHYDRAMINE HCL 25 MG PO CAPS
25.0000 mg | ORAL_CAPSULE | Freq: Once | ORAL | Status: AC
  Administered 2021-12-24: 25 mg via ORAL
  Filled 2021-12-24: qty 1

## 2021-12-24 MED ORDER — KETOROLAC TROMETHAMINE 30 MG/ML IJ SOLN: 30.0000 mg | Freq: Once | INTRAMUSCULAR | Status: DC

## 2021-12-24 MED ORDER — IBUPROFEN 800 MG PO TABS
800.0000 mg | ORAL_TABLET | Freq: Once | ORAL | Status: AC
  Administered 2021-12-24: 800 mg via ORAL
  Filled 2021-12-24: qty 1

## 2021-12-24 MED ORDER — METOCLOPRAMIDE HCL 5 MG/ML IJ SOLN: 10.0000 mg | Freq: Once | INTRAMUSCULAR | Status: DC

## 2021-12-24 NOTE — Telephone Encounter (Signed)
Telephone encounter created to modify referral order.

## 2021-12-24 NOTE — Discharge Instructions (Signed)
I have placed a referral to the neurologist for further work-up of these headaches.  Your CT scan showed potential findings for intracranial hypertension, please see the neurologist for this.  Given the ER headache started in the upper shoulder and neck, it is still reasonable to believe that the headaches are tension type headaches related to muscle tightness in your upper trapezius.  I recommend getting a massage, using ice and heat, and taking medications as prescribed.  If you have numbness, weakness, loss of vision, or double vision, you need to return to the emergency department.  You can take ibuprofen for headache.

## 2021-12-24 NOTE — ED Notes (Signed)
Pt refused COVID swab, states she has a horrible headache and that will just make it worse. MD notified. Will continue to monitor.

## 2021-12-24 NOTE — ED Triage Notes (Addendum)
Pt reports since yesterday has had a "throbbing" headache. Feels "weak" all over. No nausea, vomiting or visual changes. Took tylenol and muscle relaxer that was prescribed for neck pain last week, neither has relieved her headache. MAEx 4, equal grips, steady, independent gait.

## 2021-12-24 NOTE — ED Provider Notes (Signed)
[facility] Emergency Department Provider Note MRN:  [id number]  Arrival date & time: 12/24/21     Chief Complaint   Headache   History of Present Illness   Theresa Wood is a 51 y.o. year-old female presents to the ED with chief complaint of headache.  She states that she has been having intermittent headaches for the past couple of weeks.  She states that the headache is throbbing. She states that she has never experienced a headache like this before.  She denies any numbness, weakness, or tingling.  Denies any vision changes or slurred speech.  She states that her symptoms are improved with Tylenol. She has also tried taking a muscle relaxer without relief.    Review of Systems  Pertinent review of systems noted in HPI.    Physical Exam   Vitals:   12/24/21 0430  BP: 139/82  Pulse: (!) 102  Resp: 16  Temp: 99.1 F (37.3 C)  SpO2: 100%    CONSTITUTIONAL:  well-appearing, NAD NEURO:  Alert and oriented x 3, CN 3-12 grossly intact, normal finger to nose, normal gait, normal sensation and strength of upper and lower extremities EYES:  eyes equal and reactive ENT/NECK:  Supple, no stridor  CARDIO:  mildly tachycardic, regular rhythm, appears well-perfused  PULM:  No respiratory distress GI/GU:  non-distended,  MSK/SPINE:  No gross deformities, no edema, moves all extremities  SKIN:  no rash, atraumatic   *Additional and/or pertinent findings included in MDM below  Diagnostic and Interventional Summary    EKG Interpretation  Date/Time:    Ventricular Rate:    PR Interval:    QRS Duration:   QT Interval:    QTC Calculation:   R Axis:     Text Interpretation:         Labs Reviewed  CBC WITH DIFFERENTIAL/PLATELET - Abnormal; Notable for the following components:      Result Value   WBC 10.9 (*)    Hemoglobin 11.3 (*)    HCT 33.7 (*)    Abs Immature Granulocytes 0.12 (*)    All other components within normal limits  BASIC METABOLIC  PANEL - Abnormal; Notable for the following components:   Sodium 133 (*)    Potassium 3.3 (*)    Chloride 96 (*)    Glucose, Bld 110 (*)    Creatinine, Ser 1.03 (*)    All other components within normal limits  RESP PANEL BY RT-PCR (FLU A&B, COVID) [id number]    CT ANGIO HEAD NECK W WO CM  Final Result      Medications - No data to display   Procedures  /  Critical Care Procedures  ED Course and Medical Decision Making  I have reviewed the triage vital signs, the nursing notes, and pertinent available records from the EMR.  Complexity of Problems Addressed Acute illness or injury that poses threat of life of bodily function  Additional Data Reviewed and Analyzed Further history obtained from: Past medical history and medications listed in the EMR, Prior ED visit notes, and Prior labs/imaging results    ED Course    Patient is here with severe headache that has been intermittent for the past week or so.  States that last night it became worse than ever.  She doesn't look too uncomfortable, but is concerned that some serious is wrong.  She doesn't have any neuro deficits.  She does have tenderness to the right upper trapezius. Could be indicative of tension type headache.  She is afebrile here.  Given persistent symptoms and advancing age without history of similar, will check CTa head and neck.    I personally reviewed the I reviewed the CT, which is notable for empty sella, which could indicated intracranial hypertension, and agree with radiologist interpretation.  I discussed the results with the patient.  She is agreeable with outpatient follow-up.  I do believe that her symptoms are most likely tension type headache given the muscle tightness in her upper neck and back.  She was offered treatment with IV Toradol, Reglan, and Benadryl, but patient declined IV medication and asked that her IV be removed.  She was treated with p.o. ibuprofen and Benadryl.  Ambulatory referral  provided for neurology.  Given that she is not having any neurologic deficits or vision changes, do not feel that she requires further emergent work-up at this time.  We will send to neurology for further evaluation of potential intracranial hypertension.     Final Clinical Impressions(s) / ED Diagnoses     ICD-10-CM   1. Nonintractable headache, unspecified chronicity pattern, unspecified headache type  R51.9       ED Discharge Orders          Ordered    Ambulatory referral to Neurology       Comments: An appointment is requested in approximately: 1 week   12/24/21 0902             Discharge Instructions Discussed with and Provided to Patient:     Discharge Instructions      I have placed a referral to the neurologist for further work-up of these headaches.  Your CT scan showed potential findings for intracranial hypertension, please see the neurologist for this.  Given the ER headache started in the upper shoulder and neck, it is still reasonable to believe that the headaches are tension type headaches related to muscle tightness in your upper trapezius.  I recommend getting a massage, using ice and heat, and taking medications as prescribed.  If you have numbness, weakness, loss of vision, or double vision, you need to return to the emergency department.  You can take ibuprofen for headache.       Roxy Horseman, PA-C 12/24/21 7654    Milagros Loll, MD 12/26/21 1538

## 2021-12-26 ENCOUNTER — Ambulatory Visit (INDEPENDENT_AMBULATORY_CARE_PROVIDER_SITE_OTHER): Payer: 59 | Admitting: Psychiatry

## 2021-12-26 ENCOUNTER — Encounter: Payer: Self-pay | Admitting: Psychiatry

## 2021-12-26 VITALS — BP 147/87 | HR 96 | Ht 63.0 in | Wt 219.0 lb

## 2021-12-26 DIAGNOSIS — E236 Other disorders of pituitary gland: Secondary | ICD-10-CM | POA: Diagnosis not present

## 2021-12-26 MED ORDER — ELETRIPTAN HYDROBROMIDE 40 MG PO TABS: 40.0000 mg | ORAL_TABLET | ORAL | 3 refills | Status: DC | PRN

## 2021-12-26 NOTE — Patient Instructions (Addendum)
Start eletriptan as needed. Take at the onset of migraine. If headache recurs or does not fully resolve, you may take a second dose after 2 hours. Please avoid taking more than 2 days per week to avoid migraines Schedule appointment with your eye doctor   Natural supplements: Magnesium Oxide or Magnesium Glycinate 500 mg at bed (up to 800 mg daily) Coenzyme Q10 300 mg in AM Vitamin B2- 200 mg twice a day  Add 1 supplement at a time since even natural supplements can have undesirable side effects. You can sometimes buy supplements cheaper (especially Coenzyme Q10) at www.https://compton-perez.com/ or at [employer].  Vitamins and herbs that show potential:  Magnesium: Magnesium (250 mg twice a day or 500 mg at bed) has a relaxant effect on smooth muscles such as blood vessels. Individuals suffering from frequent or daily headache usually have low magnesium levels which can be increase with daily supplementation of 400-750 mg. Three trials found 40-90% average headache reduction  when used as a preventative. Magnesium also demonstrated the benefit in menstrually related migraine.  Magnesium is part of the messenger system in the serotonin cascade and it is a good muscle relaxant.  It is also useful for constipation which can be a side effect of other medications used to treat migraine. Good sources include nuts, whole grains, and tomatoes. Side Effects: loose stool/diarrhea Riboflavin (vitamin B 2) 200 mg twice a day. This vitamin assists nerve cells in the production of ATP a principal energy storing molecule.  It is necessary for many chemical reactions in the body.  There have been at least 3 clinical trials of riboflavin using 400 mg per day all of which suggested that migraine frequency can be decreased.  All 3 trials showed significant improvement in over half of migraine sufferers.  The supplement is found in bread, cereal, milk, meat, and poultry.  Most Americans get more riboflavin than the recommended daily  allowance, however riboflavin deficiency is not necessary for the supplements to help prevent headache. Side effects: energizing, green urine  Coenzyme Q10: This is present in almost all cells in the body and is critical component for the conversion of energy.  Recent studies have shown that a nutritional supplement of CoQ10 can reduce the frequency of migraine attacks by improving the energy production of cells as with riboflavin.  Doses of 150 mg twice a day have been shown to be effective.  HEADACHE DIET: Foods and beverages which may trigger migraine Note that only 20% of headache patients are food sensitive. You will know if you are food sensitive if you get a headache consistently 20 minutes to 2 hours after eating a certain food. Only cut out a food if it causes headaches, otherwise you might remove foods you enjoy! What matters most for diet is to eat a well balanced healthy diet full of vegetables and low fat protein, and to not miss meals.  Chocolate, other sweets ALL cheeses except cottage and cream cheese Dairy products, yogurt, sour cream, ice cream Liver Meat extracts (Bovril, Marmite, meat tenderizers) Meats or fish which have undergone aging, fermenting, pickling or smoking. These include: Hotdogs,salami,Lox,sausage, mortadellas,smoked salmon, pepperoni, Pickled herring Pods of broad bean (English beans, Chinese pea pods, [place] (fava) beans, lima and navy beans Ripe avocado, ripe banana Yeast extracts or active yeast preparations such as Brewer's or Fleishman's (commercial bakes goods are permitted) Tomato based foods, pizza (lasagna, etc.)  MSG (monosodium glutamate) is disguised as many things; look for these common aliases: Monopotassium glutamate Autolysed  yeast Hydrolysed protein Sodium caseinate flavorings all natural preservatives" Nutrasweet  Avoid all other foods that convincingly provoke headaches.  Resources: The Dizzy Lu Duffel Your Headache Diet,  migrainestrong.com  https://www.aguirre.org/  Caffeine and Migraine For patients that have migraine, caffeine intake more than 3 days per week can lead to dependency and increased migraine frequency. I would recommend cutting back on your caffeine intake as best you can. The recommended amount of caffeine is 200-300 mg daily, although migraine patients may experience dependency at even lower doses. While you may notice an increase in headache temporarily, cutting back will be helpful for headaches in the long run. For more information on caffeine and migraine, visit: https://americanmigrainefoundation.org/resource-library/caffeine-and-migraine/  Headache Prevention Strategies:  1. Maintain a headache diary; learn to identify and avoid triggers.  - This can be a simple note where you log when you had a headache, associated symptoms, and medications used - There are several smartphone apps developed to help track migraines: Migraine Buddy, Migraine Monitor, Curelator N1-Headache App  Common triggers include: Emotional triggers: Emotional/Upset family or friends Emotional/Upset occupation Business reversal/success Anticipation anxiety Crisis-serious Post-crisis periodNew job/position   Physical triggers: Vacation Day Weekend Strenuous Exercise High Altitude Location New Move Menstrual Day Physical Illness Oversleep/Not enough sleep Weather changes Light: Photophobia or light sesnitivity treatment involves a balance between desensitization and reduction in overly strong input. Use dark polarized glasses outside, but not inside. Avoid bright or fluorescent light, but do not dim environment to the point that going into a normally lit room hurts. Consider FL-41 tint lenses, which reduce the most irritating wavelengths without blocking too much light.  These can be obtained at axonoptics.com or theraspecs.com Foods: see list above.  2. Limit  use of acute treatments (over-the-counter medications, triptans, etc.) to no more than 2 days per week or 10 days per month to prevent medication overuse headache (rebound headache).    3. Follow a regular schedule (including weekends and holidays): Don't skip meals. Eat a balanced diet. 8 hours of sleep nightly. Minimize stress. Exercise 30 minutes per day. Being overweight is associated with a 5 times increased risk of chronic migraine. Keep well hydrated and drink 6-8 glasses of water per day.  4. Initiate non-pharmacologic measures at the earliest onset of your headache. Rest and quiet environment. Relax and reduce stress. Breathe2Relax is a free app that can instruct you on    some simple relaxtion and breathing techniques. Http://Dawnbuse.com is a    free website that provides teaching videos on relaxation.  Also, there are  many apps that   can be downloaded for mindful relaxation.  An app called YOGA NIDRA will help walk you through mindfulness. Another app called Calm can be downloaded to give you a structured mindfulness guide with daily reminders and skill development. Headspace for guided meditation Mindfulness Based Stress Reduction Online Course: www.palousemindfulness.com Cold compresses.  5. Don't wait!! Take the maximum allowable dosage of prescribed medication at the first sign of migraine.  6. Compliance:  Take prescribed medication regularly as directed and at the first sign of a migraine.  7. Communicate:  Call your physician when problems arise, especially if your headaches change, increase in frequency/severity, or become associated with neurological symptoms (weakness, numbness, slurred speech, etc.).  8. Headache/pain management therapies: Consider various complementary methods, including medication, behavioral therapy, psychological counselling, biofeedback, massage therapy, acupuncture, dry needling, and other modalities.  Such measures may reduce the need for  medications. Counseling for pain management, where patients learn to function and  ignore/minimize their pain, seems to work very well.

## 2021-12-26 NOTE — Progress Notes (Signed)
Referring:  No referring provider defined for this encounter.  PCP: Pcp, No  Neurology was asked to evaluate Ceanna Matecki, a 51 year old female for a chief complaint of headaches.  Our recommendations of care will be communicated by shared medical record.    CC:  headaches  HPI:  Medical co-morbidities: HTN, s/p cervical fusion  The patient presents for evaluation of headaches which began 2 weeks ago. She also had shoulder and neck pain around the same time. She presented to the ED 12/17/21 where she was given Robaxin for muscle spasms. States this did not help much.  Headaches were initially mild but worsened in severity one week later. Headache is described as occipital throbbing pain which is triggered by exertion. She denies photophobia, phonophobia, or nausea. Denies pulsatile tinnitus or vision changes. Presented to the ED 12/24/21 for her headaches, where CTA head/neck showed normal blood vessels and a partially empty sella.  Tried Tylenol as needed which reduced the pain slightly. Robaxin didn't help. Tried ibuprofen and benadryl together which helped better. She has not had a headache in 24 hours.  She does have a history of migraines in her 51s.  Headache History: Onset: 2 weeks ago Triggers: exertion Aura: none Location: occipital Quality/Description: throbbing Associated Symptoms:  Photophobia: no  Phonophobia: no  Nausea: no Worse with activity?: yes Duration of headaches: hours to days Red flags:   New onset age>50  Headache days per month: 14 Headache free days per month: 16  Current Treatment: Abortive Ibuprofen Benadryl  Preventative none  Prior Therapies                                 Lisinopril  LABS: CMP Latest Ref Rng & Units 12/24/2021 03/01/2021  Glucose 70 - 99 mg/dL 110(H) 85  BUN 6 - 20 mg/dL 8 15  Creatinine 0.44 - 1.00 mg/dL 1.03(H) 0.83  Sodium 135 - 145 mmol/L 133(L) 134(L)  Potassium 3.5 - 5.1 mmol/L 3.3(L) 3.5  Chloride 98 - 111  mmol/L 96(L) 101  CO2 22 - 32 mmol/L 24 23  Calcium 8.9 - 10.3 mg/dL 9.5 9.4  Total Protein 6.5 - 8.1 g/dL - 7.6  Total Bilirubin 0.3 - 1.2 mg/dL - 0.5  Alkaline Phos 38 - 126 U/L - 72  AST 15 - 41 U/L - 15  ALT 0 - 44 U/L - 16   CBC Latest Ref Rng & Units 12/24/2021 03/01/2021  WBC 4.0 - 10.5 K/uL 10.9(H) 9.6  Hemoglobin 12.0 - 15.0 g/dL 11.3(L) 11.1(L)  Hematocrit 36.0 - 46.0 % 33.7(L) 35.1(L)  Platelets 150 - 400 K/uL 371 395    IMAGING:  CTA head/neck: normal blood vessels, partially empty sella  Imaging independently reviewed on December 26, 2021   Current Outpatient Medications on File Prior to Visit  Medication Sig Dispense Refill   lisinopril-hydrochlorothiazide (ZESTORETIC) 20-25 MG tablet Take by mouth.     metFORMIN (GLUCOPHAGE) 1000 MG tablet Take 1,000 mg by mouth 2 (two) times daily.     naproxen (NAPROSYN) 500 MG tablet Take 500 mg by mouth 2 (two) times daily.     OZEMPIC, 1 MG/DOSE, 4 MG/3ML SOPN Inject 1 mg into the skin once a week.     methocarbamol (ROBAXIN) 500 MG tablet Take 1 tablet (500 mg total) by mouth every 8 (eight) hours as needed for muscle spasms. (Patient not taking: Reported on 12/26/2021) 20 tablet 0   No  current facility-administered medications on file prior to visit.     Allergies: No Known Allergies  Family History: Migraine or other headaches in the family:  none Aneurysms in a first degree relative:  none Brain tumors in the family:  none Other neurological illness in the family:   none  Past Medical History: Past Medical History:  Diagnosis Date   Hypertension    Migraine     Past Surgical History Past Surgical History:  Procedure Laterality Date   CERVICAL FUSION  2020   gallstone surgery      Social History: Social History   Tobacco Use   Smoking status: Never   Smokeless tobacco: Never  Vaping Use   Vaping Use: Never used  Substance Use Topics   Alcohol use: Yes    Comment: socially   Drug use: Never      ROS: Negative for fevers, chills. Positive for headaches. All other systems reviewed and negative unless stated otherwise in HPI.   Physical Exam:   Vital Signs: BP (!) 147/87 Comment: hasn't taken BP med today   Pulse 96    Ht 5\' 3"  (1.6 m)    Wt 219 lb (99.3 kg)    BMI 38.79 kg/m  GENERAL: well appearing,in no acute distress,alert SKIN:  Color, texture, turgor normal. No rashes or lesions HEAD:  Normocephalic/atraumatic. CV:  RRR RESP: Normal respiratory effort MSK: no tenderness to palpation over occiput, neck, or shoulders  NEUROLOGICAL: Mental Status: Alert, oriented to person, place and time,Follows commands Cranial Nerves: PERRL,no papilledema visualized, visual fields intact to confrontation,extraocular movements intact,facial sensation intact,no facial droop or ptosis,hearing grossly intact,no dysarthria Motor: muscle strength 5/5 both upper and lower extremities,no drift, normal tone Reflexes: 2+ throughout Sensation: intact to light touch all 4 extremities Coordination: Finger-to- nose-finger intact bilaterally Gait: normal-based   IMPRESSION: 51 year old female with a history of HTN, s/p cervical fusion who presents for evaluation of headaches and empty sella seen on CTA head. No papilledema visualized on direct fundoscopy today. She does not endorse vision changes or pulsatile tinnitus. She will make an appointment with her eye doctor for a formal fundus exam to assess for papilledema. If she does have papilledema will plan for MRV and LP. In the meantime will start Relpax for rescue given prior history of migraine. Provided supplement information for headache prevention.  PLAN: -She will set up appointment with her eye doctor for formal fundus exam -Rescue: Start Relpax 40 mg PRN -Advised patient to call office if she experiences worsening vision or headaches  I spent a total of 42 minutes chart reviewing and counseling the patient. Headache education was done.  Discussed treatment options including acute medications and natural supplements. Discussed medication overuse headache and to limit use of acute treatments to no more than 2 days/week or 10 days/month. Discussed medication side effects, adverse reactions and drug interactions. Written educational materials and patient instructions outlining all of the above were given.  Follow-up: 3 months, or sooner if needed   Genia Harold, MD 12/26/2021   2:04 PM

## 2021-12-30 ENCOUNTER — Telehealth: Payer: Self-pay | Admitting: *Deleted

## 2021-12-30 ENCOUNTER — Encounter: Payer: Self-pay | Admitting: *Deleted

## 2021-12-30 NOTE — Telephone Encounter (Signed)
Eletriptan needs PA, [facility], spoke with Watt Climes, authorization on file for 10 tabs for 15 days. I informed her that is incorrect. New PA started 10 tabs/30 days.  Answered clinical questions, case ID [id number]. Approved 11/30/21- 12/30/22. 18 tabs per dispense at retail, 54 tabs per dispense with mail order. Will send my chart to advise patient.

## 2021-12-31 ENCOUNTER — Encounter: Payer: Self-pay | Admitting: Psychiatry

## 2022-02-19 ENCOUNTER — Ambulatory Visit: Payer: 59 | Admitting: Neurology

## 2022-07-08 ENCOUNTER — Encounter (HOSPITAL_COMMUNITY): Payer: Self-pay

## 2022-07-08 ENCOUNTER — Ambulatory Visit (HOSPITAL_COMMUNITY)
Admission: EM | Admit: 2022-07-08 | Discharge: 2022-07-08 | Disposition: A | Discharge status: Home / Self Care | Payer: 59 | Attending: Internal Medicine | Admitting: Internal Medicine

## 2022-07-08 DIAGNOSIS — J069 Acute upper respiratory infection, unspecified: Secondary | ICD-10-CM

## 2022-07-08 DIAGNOSIS — U071 COVID-19: Secondary | ICD-10-CM | POA: Diagnosis not present

## 2022-07-08 LAB — POC INFLUENZA A AND B ANTIGEN (URGENT CARE ONLY)
INFLUENZA A ANTIGEN, POC: NEGATIVE
INFLUENZA B ANTIGEN, POC: NEGATIVE

## 2022-07-08 LAB — SARS CORONAVIRUS 2 (TAT 6-24 HRS): SARS Coronavirus 2: POSITIVE — AB

## 2022-07-08 MED ORDER — ACETAMINOPHEN 325 MG PO TABS
650.0000 mg | ORAL_TABLET | Freq: Once | ORAL | Status: AC
  Administered 2022-07-08: 650 mg via ORAL

## 2022-07-08 MED ORDER — ACETAMINOPHEN 325 MG PO TABS
ORAL_TABLET | ORAL | Status: AC
  Filled 2022-07-08: qty 2

## 2022-07-08 NOTE — ED Triage Notes (Signed)
Pt states fever,cough,headache,and body aches for the past 2 days.

## 2022-07-08 NOTE — Discharge Instructions (Signed)
Your flu test is negative COVID test is pending, we will call you with the results or you can check MyChart If positive the recommendation is to stay home away from others for 5 days and then wear a mask around others for 5 days. Recommend supportive care.  Drink plenty of fluids.  Take Tylenol or Ibuprofen as needed for fever or body aches.  Can take Mucinex and Flonase for congestion and sinus pressure.

## 2022-07-08 NOTE — ED Provider Notes (Signed)
[facility]    CSN: [id number] Arrival date & time: 07/08/22  1609      History   Chief Complaint Chief Complaint  Patient presents with   Cough    HPI Theresa Wood is a 51 y.o. female.   Pt complains of fever, cough, headache, and body aches that started about 2 days ago.  She reports she has been taking tylenol at home with improvement of fever, last dose 5-6 hours ago.  She reports mild congestion.  Denies n/v/d.  She denies known sick contacts.     Past Medical History:  Diagnosis Date   Hypertension    Migraine     There are no problems to display for this patient.   Past Surgical History:  Procedure Laterality Date   CERVICAL FUSION  2020   gallstone surgery      OB History   No obstetric history on file.      Home Medications    Prior to Admission medications   Medication Sig Start Date End Date Taking? Authorizing Provider  eletriptan (RELPAX) 40 MG tablet Take 1 tablet (40 mg total) by mouth as needed for migraine or headache. May repeat in 2 hours if headache persists or recurs. 12/26/21   Ocie Doyne, MD  lisinopril-hydrochlorothiazide (ZESTORETIC) 20-25 MG tablet Take by mouth. 12/06/19   [provider]  metFORMIN (GLUCOPHAGE) 1000 MG tablet Take 1,000 mg by mouth 2 (two) times daily. 09/18/21   [provider]  naproxen (NAPROSYN) 500 MG tablet Take 500 mg by mouth 2 (two) times daily. 02/21/21   [provider]  OZEMPIC, 1 MG/DOSE, 4 MG/3ML SOPN Inject 1 mg into the skin once a week. 12/24/21   [provider]    Family History Family History  Problem Relation Age of Onset   Cancer Mother        Cervical     Social History Social History   Tobacco Use   Smoking status: Never   Smokeless tobacco: Never  Vaping Use   Vaping Use: Never used  Substance Use Topics   Alcohol use: Yes    Comment: socially   Drug use: Never     Allergies   Patient has no known allergies.   Review of  Systems Review of Systems  Constitutional:  Positive for chills and fever.  HENT:  Negative for ear pain and sore throat.   Eyes:  Negative for pain and visual disturbance.  Respiratory:  Positive for cough. Negative for shortness of breath.   Cardiovascular:  Negative for chest pain and palpitations.  Gastrointestinal:  Negative for abdominal pain and vomiting.  Genitourinary:  Negative for dysuria and hematuria.  Musculoskeletal:  Positive for myalgias. Negative for arthralgias and back pain.  Skin:  Negative for color change and rash.  Neurological:  Negative for seizures and syncope.  All other systems reviewed and are negative.    Physical Exam Triage Vital Signs ED Triage Vitals  Enc Vitals Group     BP 07/08/22 1723 112/72     Pulse Rate 07/08/22 1723 (!) 105     Resp 07/08/22 1723 16     Temp 07/08/22 1723 (!) 102.9 F (39.4 C)     Temp Source 07/08/22 1723 Oral     SpO2 07/08/22 1723 97 %     Weight --      Height --      Head Circumference --      Peak Flow --  Pain Score 07/08/22 1724 0     Pain Loc --      Pain Edu? --      Excl. in GC? --    No data found.  Updated Vital Signs BP 112/72 (BP Location: Left Arm)   Pulse (!) 105   Temp (!) 102.9 F (39.4 C) (Oral)   Resp 16   LMP 03/10/2022 (Approximate)   SpO2 97%   Visual Acuity Right Eye Distance:   Left Eye Distance:   Bilateral Distance:    Right Eye Near:   Left Eye Near:    Bilateral Near:     Physical Exam Vitals and nursing note reviewed.  Constitutional:      General: She is not in acute distress.    Appearance: She is well-developed.  HENT:     Head: Normocephalic and atraumatic.  Eyes:     Conjunctiva/sclera: Conjunctivae normal.  Cardiovascular:     Rate and Rhythm: Normal rate and regular rhythm.     Heart sounds: No murmur heard. Pulmonary:     Effort: Pulmonary effort is normal. No respiratory distress.     Breath sounds: Normal breath sounds.  Abdominal:      Palpations: Abdomen is soft.     Tenderness: There is no abdominal tenderness.  Musculoskeletal:        General: No swelling.     Cervical back: Neck supple.  Skin:    General: Skin is warm and dry.     Capillary Refill: Capillary refill takes less than 2 seconds.  Neurological:     Mental Status: She is alert.  Psychiatric:        Mood and Affect: Mood normal.      UC Treatments / Results  Labs (all labs ordered are listed, but only abnormal results are displayed) Labs Reviewed  SARS CORONAVIRUS 2 (TAT 6-24 HRS)  POC INFLUENZA A AND B ANTIGEN (URGENT CARE ONLY)    EKG   Radiology No results found.  Procedures Procedures (including critical care time)  Medications Ordered in UC Medications  acetaminophen (TYLENOL) tablet 650 mg (650 mg Oral Given 07/08/22 1732)    Initial Impression / Assessment and Plan / UC Course  I have reviewed the triage vital signs and the nursing notes.  Pertinent labs & imaging results that were available during my care of the patient were reviewed by me and considered in my medical decision making (see chart for details).     Viral URI, flu negative.  Covid test pending.  Discussed self isolation if COVID positive.  Pt febrile, Tylenol give in clinic.  Pt overall well appearing, in no acute distress, stable for discharge. Return precautions discussed.  Final Clinical Impressions(s) / UC Diagnoses   Final diagnoses:  Viral URI with cough     Discharge Instructions      Your flu test is negative COVID test is pending, we will call you with the results or you can check MyChart If positive the recommendation is to stay home away from others for 5 days and then wear a mask around others for 5 days. Recommend supportive care.  Drink plenty of fluids.  Take Tylenol or Ibuprofen as needed for fever or body aches.  Can take Mucinex and Flonase for congestion and sinus pressure.     ED Prescriptions   None    PDMP not reviewed this  encounter.   Ward, Tylene Fantasia, PA-C 07/08/22 [phone]

## 2022-07-09 ENCOUNTER — Telehealth (HOSPITAL_COMMUNITY): Payer: Self-pay | Admitting: Emergency Medicine

## 2022-07-09 MED ORDER — NIRMATRELVIR/RITONAVIR (PAXLOVID)TABLET: 3.0000 | ORAL_TABLET | Freq: Two times a day (BID) | ORAL | 0 refills | Status: DC

## 2022-07-09 MED ORDER — NIRMATRELVIR/RITONAVIR (PAXLOVID)TABLET: 3.0000 | ORAL_TABLET | Freq: Two times a day (BID) | ORAL | 0 refills | Status: AC

## 2022-07-26 ENCOUNTER — Other Ambulatory Visit: Payer: Self-pay

## 2022-07-26 ENCOUNTER — Emergency Department (HOSPITAL_BASED_OUTPATIENT_CLINIC_OR_DEPARTMENT_OTHER)
Admission: EM | Admit: 2022-07-26 | Discharge: 2022-07-27 | Disposition: A | Discharge status: Home / Self Care | Payer: 59 | Attending: Emergency Medicine | Admitting: Emergency Medicine

## 2022-07-26 ENCOUNTER — Encounter (HOSPITAL_BASED_OUTPATIENT_CLINIC_OR_DEPARTMENT_OTHER): Payer: Self-pay | Admitting: Emergency Medicine

## 2022-07-26 DIAGNOSIS — Z79899 Other long term (current) drug therapy: Secondary | ICD-10-CM | POA: Insufficient documentation

## 2022-07-26 DIAGNOSIS — R209 Unspecified disturbances of skin sensation: Secondary | ICD-10-CM | POA: Insufficient documentation

## 2022-07-26 DIAGNOSIS — M546 Pain in thoracic spine: Secondary | ICD-10-CM | POA: Diagnosis not present

## 2022-07-26 DIAGNOSIS — M25511 Pain in right shoulder: Secondary | ICD-10-CM | POA: Diagnosis present

## 2022-07-26 DIAGNOSIS — M549 Dorsalgia, unspecified: Secondary | ICD-10-CM

## 2022-07-26 DIAGNOSIS — I10 Essential (primary) hypertension: Secondary | ICD-10-CM | POA: Insufficient documentation

## 2022-07-26 NOTE — ED Triage Notes (Addendum)
Pt presents to ED POV. Pt c/o R chronic R shoulder. Pt also c/o bilateral leg weakness since last night and tingling in her finger tips. Covid+ 3w ago. Some relief of pain with ibuprofen at home. Hx of spinal fusions

## 2022-07-27 MED ORDER — METHOCARBAMOL 500 MG PO TABS: 1000.0000 mg | ORAL_TABLET | Freq: Two times a day (BID) | ORAL | 0 refills | Status: DC

## 2022-07-27 NOTE — ED Provider Notes (Signed)
[facility] DEPT Provider Note  CSN: [id number] Arrival date & time: 07/26/22 2121  Chief Complaint(s) Shoulder Pain  HPI Theresa Wood is a 51 y.o. female with a past medical history listed below including cervical stenosis status post C5-C7 ACDF here for 3 days of right shoulder aching pain with paresthesias to the right arm. No weakness.  Pain is worse with palpation, certain positions and range of motion.  Improved with over-the-counter medication but returned when medicine wears off.  She reports that 3 weeks ago she had COVID but denies any recent fevers or other infections. She denies any fall or trauma.  Additionally patient is reporting 2 days of intermittent brief bilateral lower extremity tingling that resolves spontaneously.  No associated weakness.  No ataxia.  No bladder/bowel incontinence.  The history is provided by the patient.    Past Medical History Past Medical History:  Diagnosis Date   Hypertension    Migraine    There are no problems to display for this patient.  Home Medication(s) Prior to Admission medications   Medication Sig Start Date End Date Taking? Authorizing Provider  methocarbamol (ROBAXIN) 500 MG tablet Take 2 tablets (1,000 mg total) by mouth 2 (two) times daily. 07/27/22  Yes Pollie Poma, Grayce Sessions, MD  eletriptan (RELPAX) 40 MG tablet Take 1 tablet (40 mg total) by mouth as needed for migraine or headache. May repeat in 2 hours if headache persists or recurs. 12/26/21   Genia Harold, MD  lisinopril-hydrochlorothiazide (ZESTORETIC) 20-25 MG tablet Take by mouth. 12/06/19   [provider]  metFORMIN (GLUCOPHAGE) 1000 MG tablet Take 1,000 mg by mouth 2 (two) times daily. 09/18/21   [provider]  naproxen (NAPROSYN) 500 MG tablet Take 500 mg by mouth 2 (two) times daily. 02/21/21   [provider]  OZEMPIC, 1 MG/DOSE, 4 MG/3ML SOPN Inject 1 mg into the skin once a week. 12/24/21   [provider]                                                                                                                                    Allergies Patient has no known allergies.  Review of Systems Review of Systems As noted in HPI  Physical Exam Vital Signs  I have reviewed the triage vital signs BP (!) 98/57   Pulse 73   Temp 97.9 F (36.6 C) (Oral)   Resp 18   LMP 03/10/2022 (Approximate)   SpO2 100%   Physical Exam Vitals reviewed.  Constitutional:      General: She is not in acute distress.    Appearance: She is well-developed. She is not diaphoretic.  HENT:     Head: Normocephalic and atraumatic.     Right Ear: External ear normal.     Left Ear: External ear normal.     Nose: Nose normal.  Eyes:     General: No scleral icterus.  Conjunctiva/sclera: Conjunctivae normal.  Neck:     Trachea: Phonation normal.  Cardiovascular:     Rate and Rhythm: Normal rate and regular rhythm.  Pulmonary:     Effort: Pulmonary effort is normal. No respiratory distress.     Breath sounds: No stridor.  Abdominal:     General: There is no distension.  Musculoskeletal:        General: Normal range of motion.     Cervical back: Normal range of motion. No spasms, tenderness or bony tenderness.     Thoracic back: Spasms and tenderness present. No bony tenderness.     Lumbar back: No bony tenderness.       Back:  Neurological:     Mental Status: She is alert and oriented to person, place, and time.     Cranial Nerves: Cranial nerves 2-12 are intact.     Sensory: Sensation is intact.     Motor: Motor function is intact.     Gait: Gait is intact.     Comments: No clonus  Psychiatric:        Behavior: Behavior normal.     ED Results and Treatments Labs (all labs ordered are listed, but only abnormal results are displayed) Labs Reviewed - No data to display                                                                                                                        EKG  EKG Interpretation  Date/Time:    Ventricular Rate:    PR Interval:    QRS Duration:   QT Interval:    QTC Calculation:   R Axis:     Text Interpretation:         Radiology No results found.  Medications Ordered in ED Medications - No data to display                                                                                                                                   Procedures Procedures  (including critical care time)  Medical Decision Making / ED Course   Medical Decision Making   Patient presents with right shoulder pain and right upper extremity paresthesias.  Tender to palpation along parascapular musculature on the right.  No weakness noted.  Denied any trauma concerning for hardware instability or fracture requiring imaging at this time. Recommend continue supportive management for the time being and  close PCP follow-up if symptoms do not resolve.. Lower extremity paresthesias are not concerning for upper motor neuron etiology or CVA.  Recommend monitoring PCP follow-up.      Final Clinical Impression(s) / ED Diagnoses Final diagnoses:  Upper back pain on right side   The patient appears reasonably screened and/or stabilized for discharge and I doubt any other medical condition or other [facility] requiring further screening, evaluation, or treatment in the ED at this time. I have discussed the findings, Dx and Tx plan with the patient/family who expressed understanding and agree(s) with the plan. Discharge instructions discussed at length. The patient/family was given strict return precautions who verbalized understanding of the instructions. No further questions at time of discharge.  Disposition: Discharge  Condition: Good  ED Discharge Orders          Ordered    methocarbamol (ROBAXIN) 500 MG tablet  2 times daily        07/27/22 0200             Follow Up: Primary care provider  Call  to schedule an appointment for close follow  up           This chart was dictated using voice recognition software.  Despite best efforts to proofread,  errors can occur which can change the documentation meaning.    Fatima Blank, MD 07/27/22 0201

## 2022-07-27 NOTE — Discharge Instructions (Addendum)
You may use over-the-counter Motrin (Ibuprofen), Acetaminophen (Tylenol), topical muscle creams such as SalonPas, Icy Hot, Bengay, etc. Please stretch, apply ice or heat (whichever helps), and have massage therapy for additional assistance.  

## 2023-02-24 ENCOUNTER — Ambulatory Visit (HOSPITAL_COMMUNITY)
Admission: EM | Admit: 2023-02-24 | Discharge: 2023-02-24 | Disposition: A | Discharge status: Home / Self Care | Payer: 59 | Attending: Family Medicine | Admitting: Family Medicine

## 2023-02-24 ENCOUNTER — Encounter (HOSPITAL_COMMUNITY): Payer: Self-pay

## 2023-02-24 DIAGNOSIS — Z1152 Encounter for screening for COVID-19: Secondary | ICD-10-CM | POA: Diagnosis present

## 2023-02-24 DIAGNOSIS — J069 Acute upper respiratory infection, unspecified: Secondary | ICD-10-CM

## 2023-02-24 HISTORY — DX: Type 2 diabetes mellitus without complications: E11.9

## 2023-02-24 LAB — SARS CORONAVIRUS 2 (TAT 6-24 HRS): SARS Coronavirus 2: NEGATIVE

## 2023-02-24 MED ORDER — BENZONATATE 200 MG PO CAPS: 200.0000 mg | ORAL_CAPSULE | Freq: Three times a day (TID) | ORAL | 0 refills | Status: DC | PRN

## 2023-02-24 MED ORDER — METHYLPREDNISOLONE 4 MG PO TBPK: ORAL_TABLET | ORAL | 0 refills | Status: DC

## 2023-02-24 NOTE — Discharge Instructions (Addendum)
You were seen today for upper respiratory symptoms.  This could be viral or allergy.  We have swabbed you for covid and this will be resulted tomorrow, although you are outside the window for treatment with any antivirals.  I have sent out a steroid pack and cough pill to help.  You may also take over the counter claritin or zytec to help dry up sinus congestion.  If you are not improving by next week, or spiking fevers or having a lot of sinus pain/pressure then please return for further evaluation.

## 2023-02-24 NOTE — ED Provider Notes (Signed)
[facility]    CSN: [id number] Arrival date & time: 02/24/23  7829      History   Chief Complaint Chief Complaint  Patient presents with   Cough   Sore Throat    HPI Theresa Wood is a 52 y.o. female.   She has had uri symptoms x 4 days.  Woke up on Saturday with phlegm in her throat, with cough.  Thought it was allergies, but this is different.   She is having a constant cough, lots of phlegm, sore throat.  No fevers/chills.  Runny nose, congestion, drainage.   No sinus pain/pressure.  No wheezing or sob.  She has been taking theraflu, nyquil, allegra, tyelnol.        Past Medical History:  Diagnosis Date   Diabetes mellitus without complication    Hypertension    Migraine     There are no problems to display for this patient.   Past Surgical History:  Procedure Laterality Date   CERVICAL FUSION  2020   CHOLECYSTECTOMY     gallstone surgery      OB History   No obstetric history on file.      Home Medications    Prior to Admission medications   Medication Sig Start Date End Date Taking? Authorizing Provider  eletriptan (RELPAX) 40 MG tablet Take 1 tablet (40 mg total) by mouth as needed for migraine or headache. May repeat in 2 hours if headache persists or recurs. 12/26/21   Ocie Doyne, MD  lisinopril-hydrochlorothiazide (ZESTORETIC) 20-25 MG tablet Take by mouth. 12/06/19   [provider]  metFORMIN (GLUCOPHAGE) 1000 MG tablet Take 1,000 mg by mouth 2 (two) times daily. 09/18/21   [provider]  methocarbamol (ROBAXIN) 500 MG tablet Take 2 tablets (1,000 mg total) by mouth 2 (two) times daily. 07/27/22   Nira Conn, MD  naproxen (NAPROSYN) 500 MG tablet Take 500 mg by mouth 2 (two) times daily. 02/21/21   [provider]  OZEMPIC, 1 MG/DOSE, 4 MG/3ML SOPN Inject 1 mg into the skin once a week. 12/24/21   [provider]    Family History Family History  Problem Relation Age of Onset    Cancer Mother        Cervical     Social History Social History   Tobacco Use   Smoking status: Never   Smokeless tobacco: Never  Vaping Use   Vaping Use: Never used  Substance Use Topics   Alcohol use: Yes    Comment: socially   Drug use: Never     Allergies   Patient has no known allergies.   Review of Systems Review of Systems  Constitutional:  Negative for chills and fever.  HENT:  Positive for congestion, rhinorrhea and sore throat.   Respiratory:  Positive for cough. Negative for shortness of breath and wheezing.   Gastrointestinal: Negative.   Musculoskeletal: Negative.   Psychiatric/Behavioral: Negative.       Physical Exam Triage Vital Signs ED Triage Vitals  Enc Vitals Group     BP 02/24/23 0846 114/75     Pulse Rate 02/24/23 0846 82     Resp 02/24/23 0846 14     Temp 02/24/23 0846 98.6 F (37 C)     Temp Source 02/24/23 0846 Oral     SpO2 02/24/23 0846 96 %     Weight --      Height --      Head Circumference --  Peak Flow --      Pain Score 02/24/23 0848 4     Pain Loc --      Pain Edu? --      Excl. in GC? --    No data found.  Updated Vital Signs BP 114/75 (BP Location: Left Arm)   Pulse 82   Temp 98.6 F (37 C) (Oral)   Resp 14   SpO2 96%   Visual Acuity Right Eye Distance:   Left Eye Distance:   Bilateral Distance:    Right Eye Near:   Left Eye Near:    Bilateral Near:     Physical Exam Constitutional:      Appearance: She is well-developed.  HENT:     Nose: Congestion and rhinorrhea present.     Mouth/Throat:     Mouth: Mucous membranes are moist.     Pharynx: No pharyngeal swelling, oropharyngeal exudate, posterior oropharyngeal erythema or uvula swelling.  Cardiovascular:     Rate and Rhythm: Normal rate and regular rhythm.     Heart sounds: Normal heart sounds.  Musculoskeletal:     Cervical back: Normal range of motion and neck supple.  Lymphadenopathy:     Cervical: Cervical adenopathy present.  Skin:     General: Skin is warm.  Neurological:     General: No focal deficit present.     Mental Status: She is alert.  Psychiatric:        Mood and Affect: Mood normal.      UC Treatments / Results  Labs (all labs ordered are listed, but only abnormal results are displayed) Labs Reviewed  SARS CORONAVIRUS 2 (TAT 6-24 HRS)    EKG   Radiology No results found.  Procedures Procedures (including critical care time)  Medications Ordered in UC Medications - No data to display  Initial Impression / Assessment and Plan / UC Course  I have reviewed the triage vital signs and the nursing notes.  Pertinent labs & imaging results that were available during my care of the patient were reviewed by me and considered in my medical decision making (see chart for details).   Final Clinical Impressions(s) / UC Diagnoses   Final diagnoses:  Upper respiratory tract infection, unspecified type  Encounter for screening for COVID-19     Discharge Instructions      You were seen today for upper respiratory symptoms.  This could be viral or allergy.  We have swabbed you for covid and this will be resulted tomorrow, although you are outside the window for treatment with any antivirals.  I have sent out a steroid pack and cough pill to help.  You may also take over the counter claritin or zytec to help dry up sinus congestion.  If you are not improving by next week, or spiking fevers or having a lot of sinus pain/pressure then please return for further evaluation.      ED Prescriptions     Medication Sig Dispense Auth. Provider   methylPREDNISolone (MEDROL DOSEPAK) 4 MG TBPK tablet Take as directed 1 each Rocket Gunderson, MD   benzonatate (TESSALON) 200 MG capsule Take 1 capsule (200 mg total) by mouth 3 (three) times daily as needed for cough. 21 capsule Jannifer Franklin, MD      PDMP not reviewed this encounter.   Jannifer Franklin, MD 02/24/23 [phone]

## 2023-02-24 NOTE — ED Triage Notes (Signed)
Patient c/o a productive cough with light yellow sputum, sore throat, and nasal congestion x 3 days.  Patient states she has been taking Nyquil, TheraFlu, and Allegra.  Patient states she took Tylenol ES 1 tab at 0630 today.

## 2023-06-03 IMAGING — DX DG CERVICAL SPINE COMPLETE 4+V
5 series · 5 of 5 positions shown · non-contrast
Comparison: None.

CLINICAL DATA: Headaches for 2 weeks. Neck and shoulder pain for
over a month. Previous shoulder surgery.

EXAM:
CERVICAL SPINE - COMPLETE 4+ VIEW

[c-spine lat]
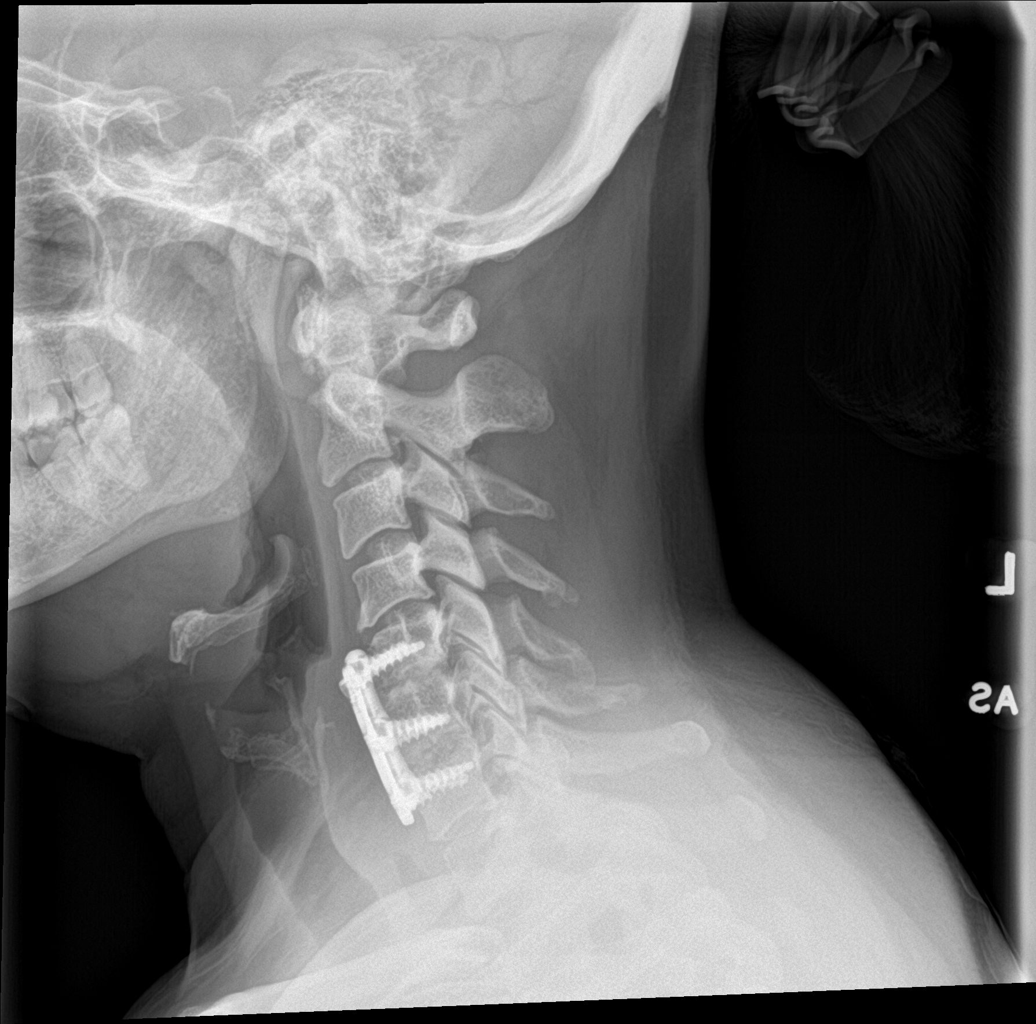

[c-spine obl (1 of 2)]
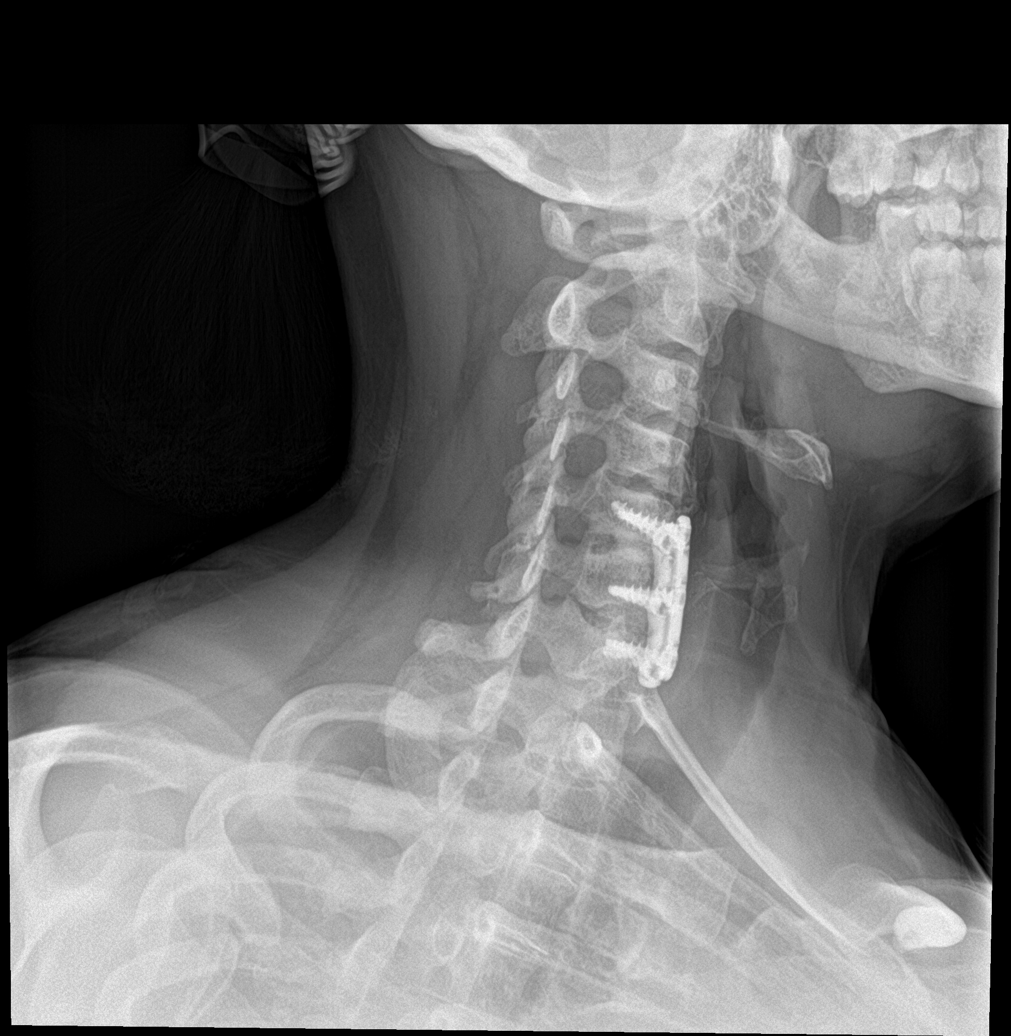

[c-spine obl (2 of 2)]
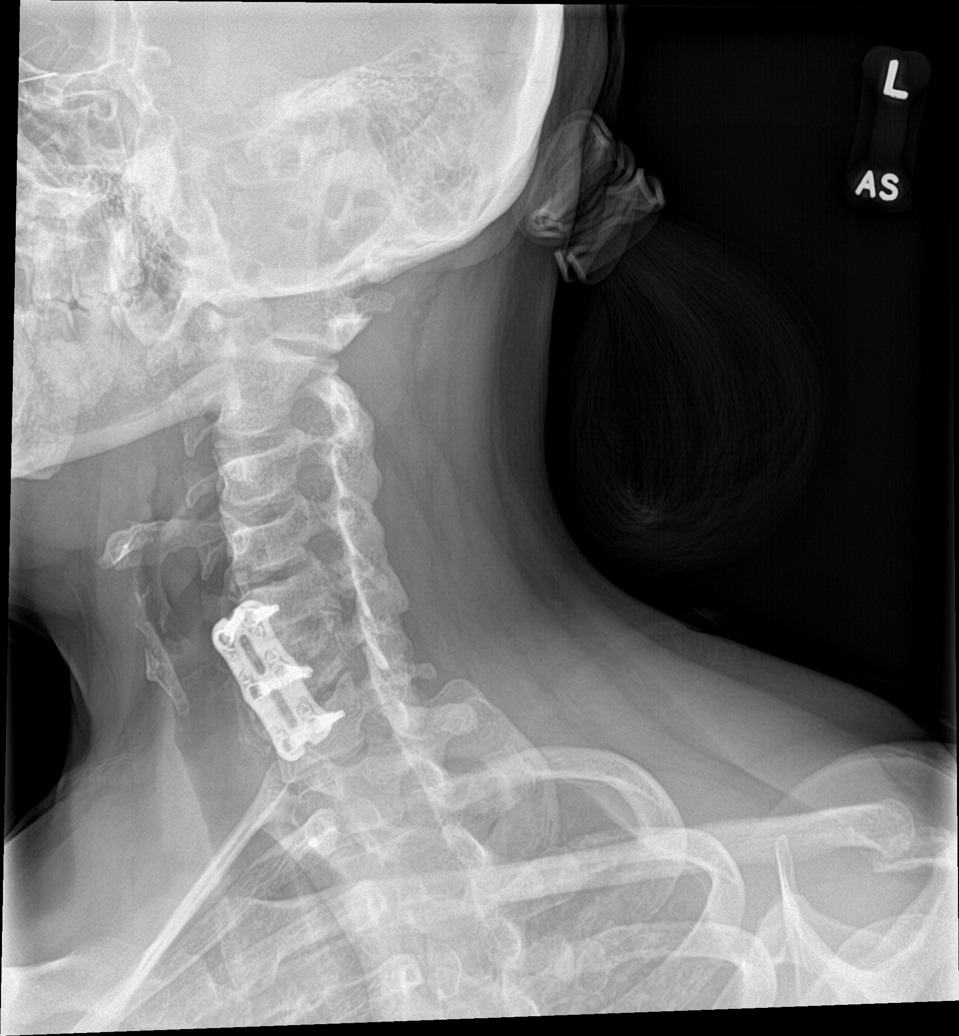

[c-spine ap]
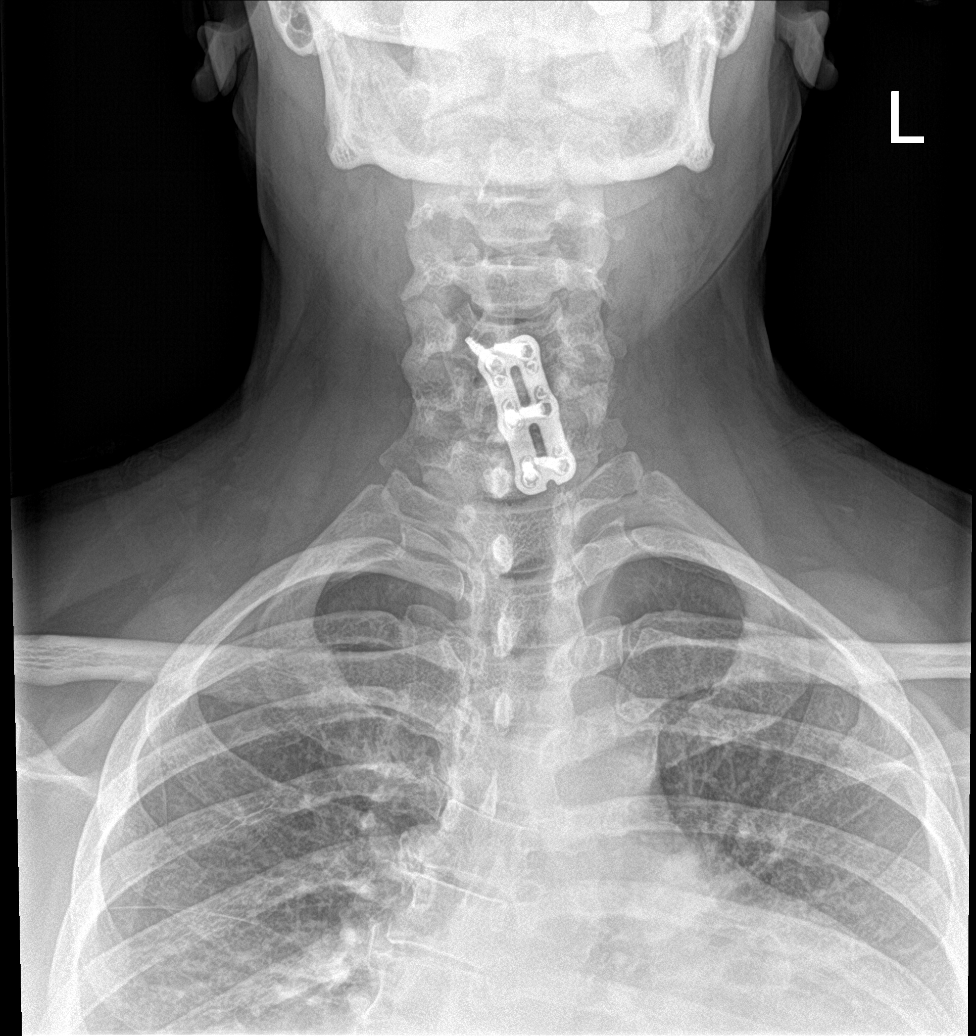

[c-spine open mouth]
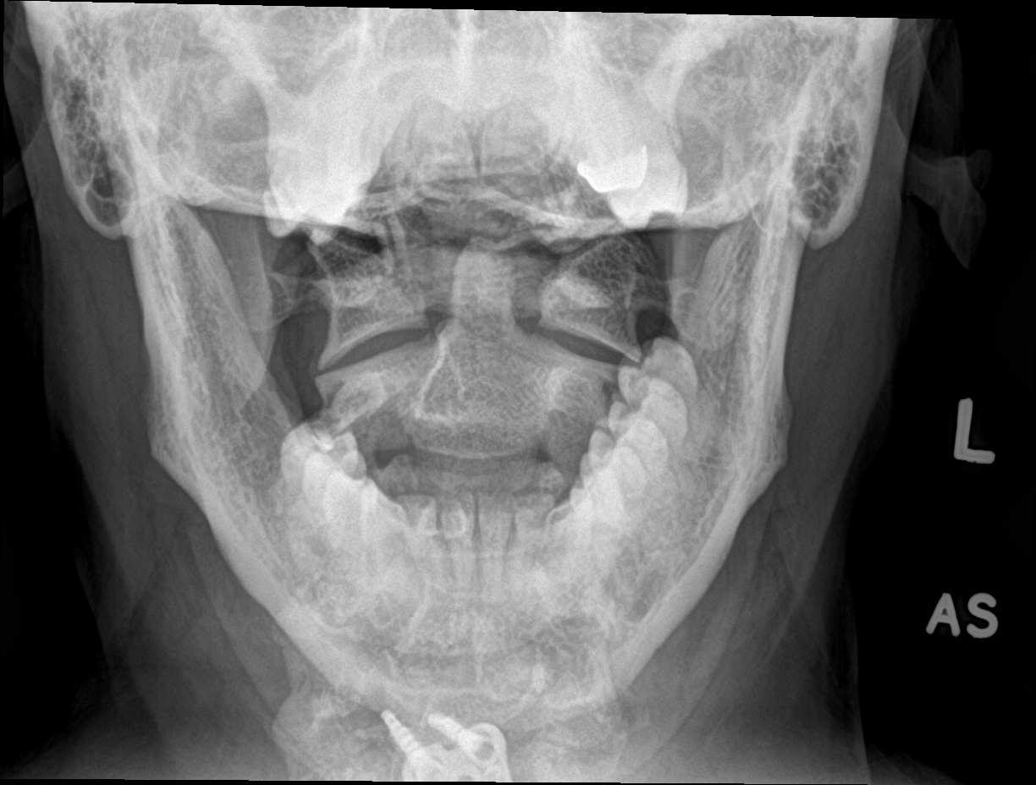

[5 of 5 positions shown; findings below may reference images not displayed]

FINDINGS: Postoperative changes with anterior plate and screw fixation and
intervertebral fusion at C5 through C7. Fused segments appear
intact. Hardware appears intact. Straightening of usual cervical
lordosis is likely positional but could indicate muscle spasm. No
abnormal anterior subluxations. Normal alignment of the posterior
elements. C1-2 articulation appears intact. No significant bone
encroachment upon the neural foramina. No vertebral compression
deformities. Degenerative changes are demonstrated at C4-5 level. No
prevertebral soft tissue swelling.
IMPRESSION: Postoperative anterior fixation of C5 through C7. Surgical hardware
in few segments appear intact. Nonspecific straightening of usual
cervical lordosis. Degenerative changes at C4-5.

## 2023-06-19 ENCOUNTER — Ambulatory Visit (INDEPENDENT_AMBULATORY_CARE_PROVIDER_SITE_OTHER): Payer: 59

## 2023-06-19 ENCOUNTER — Other Ambulatory Visit (HOSPITAL_BASED_OUTPATIENT_CLINIC_OR_DEPARTMENT_OTHER): Payer: Self-pay

## 2023-06-19 ENCOUNTER — Ambulatory Visit (HOSPITAL_BASED_OUTPATIENT_CLINIC_OR_DEPARTMENT_OTHER): Payer: 59 | Admitting: Student

## 2023-06-19 ENCOUNTER — Encounter (HOSPITAL_BASED_OUTPATIENT_CLINIC_OR_DEPARTMENT_OTHER): Payer: Self-pay | Admitting: Student

## 2023-06-19 DIAGNOSIS — M542 Cervicalgia: Secondary | ICD-10-CM

## 2023-06-19 DIAGNOSIS — Z981 Arthrodesis status: Secondary | ICD-10-CM

## 2023-06-19 DIAGNOSIS — M5382 Other specified dorsopathies, cervical region: Secondary | ICD-10-CM | POA: Diagnosis not present

## 2023-06-19 MED ORDER — METHOCARBAMOL 500 MG PO TABS
500.0000 mg | ORAL_TABLET | Freq: Four times a day (QID) | ORAL | 0 refills | Status: AC
  Filled 2023-06-19: qty 40, 10d supply, fill #0

## 2023-06-19 NOTE — Progress Notes (Signed)
Chief Complaint: Neck weakness     History of Present Illness:    Theresa Wood is a 52 y.o. female presenting today for evaluation of weakness in her neck.  This began about 4 days ago and she does not recall any specific injury or overuse.  She does work from home on a computer for 10 hours daily.  She denies any neck pain, however states that her neck feels "shaky" and her head feels heavy.  Does have a history of a C5-C7 fusion performed at [facility] in 2020.  Does not report decreased range of motion, however does feel a grinding sensation when moving her neck.  States that she took a Flexeril last night which made her very drowsy but did not help her symptoms.  Denies any radiating pain into the shoulders, arms, or thoracic spine.  Any numbness or tingling.  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   Surgical History:   C5-C7 cervical fusion - 2020  PMH/PSH/Family History/Social History/Meds/Allergies:    Past Medical History:  Diagnosis Date   Diabetes mellitus without complication (HCC)    Hypertension    Migraine    Past Surgical History:  Procedure Laterality Date   CERVICAL FUSION  2020   CHOLECYSTECTOMY     gallstone surgery     Social History   Socioeconomic History   Marital status: Single    Spouse name: Not on file   Number of children: 2   Years of education: Not on file   Highest education level: Bachelor's degree (e.g., BA, AB, BS)  Occupational History   Not on file  Tobacco Use   Smoking status: Never   Smokeless tobacco: Never  Vaping Use   Vaping status: Never Used  Substance and Sexual Activity   Alcohol use: Yes    Comment: socially   Drug use: Never   Sexual activity: Not on file  Other Topics Concern   Not on file  Social History Narrative   Coffee 2-3 x week   Social Determinants of Health   Financial Resource Strain: Low Risk  (06/10/2022)    Received from [facility], [facility]   Overall Financial Resource Strain (CARDIA)    Difficulty of Paying Living Expenses: Not very hard  Food Insecurity: No Food Insecurity (06/10/2022)   Received from [facility], [facility]   Hunger Vital Sign    Worried About Running Out of Food in the Last Year: Never true    Ran Out of Food in the Last Year: Never true  Transportation Needs: No Transportation Needs (06/10/2022)   Received from [facility], [facility]   [facility] - Transportation    In the past 12 months, has lack of transportation kept you from medical appointments or from getting medications?: No    Lack of Transportation (Non-Medical): No  Physical Activity: Insufficiently Active (03/13/2021)   Received from [facility], [facility]   Exercise Vital Sign    Days of Exercise per Week: 1 day    Minutes of Exercise per Session: 90 min  Stress: No Stress Concern Present (03/13/2021)   Received from [facility], [facility]  [employer] [facility]ational [facility] - Occupational Stress Questionnaire    Feeling of Stress : Only a little  Social Connections: Moderately Isolated (03/13/2021)   Received from [facility], [facility]   Social Connection and Isolation Panel [NHANES]    Frequency of Communication with Friends and Family: More than three times a week    Frequency of Social Gatherings with Friends and Family: Once a week    Attends Religious Services: 1 to 4 times per year    Active Member of [employer] or Organizations: No    Attends Engineer, structural: Never    Marital Status: Never married   Family History  Problem Relation Age of Onset   Cancer Mother        Cervical    No Known Allergies Current Outpatient Medications  Medication Sig Dispense Refill    methocarbamol (ROBAXIN) 500 MG tablet Take 1 tablet (500 mg total) by mouth 4 (four) times daily for 10 days. 40 tablet 0   benzonatate (TESSALON) 200 MG capsule Take 1 capsule (200 mg total) by mouth 3 (three) times daily as needed for cough. 21 capsule 0   eletriptan (RELPAX) 40 MG tablet Take 1 tablet (40 mg total) by mouth as needed for migraine or headache. May repeat in 2 hours if headache persists or recurs. 10 tablet 3   lisinopril-hydrochlorothiazide (ZESTORETIC) 20-25 MG tablet Take by mouth.     metFORMIN (GLUCOPHAGE) 1000 MG tablet Take 1,000 mg by mouth 2 (two) times daily.     methylPREDNISolone (MEDROL DOSEPAK) 4 MG TBPK tablet Take as directed 1 each 0   naproxen (NAPROSYN) 500 MG tablet Take 500 mg by mouth 2 (two) times daily.     No current facility-administered medications for this visit.   No results found.  Review of Systems:   A ROS was performed including pertinent positives and negatives as documented in the HPI.  Physical Exam :   Constitutional: NAD and appears stated age Neurological: Alert and oriented Psych: Appropriate affect and cooperative There were no vitals taken for this visit.   Comprehensive Musculoskeletal Exam:    Mild tenderness over C5-C7 spinous processes.  No paraspinal muscle tenderness or tension noted.  Cervical range of motion with flexion, extension, rotation, and sidebending is equal and as expected given previous fusion.  Negative Spurling's and grip strength is 5/5 bilaterally.  Full shoulder AROM bilaterally with forward flexion, IR, and ER.  Some posterior chain muscle tension, particularly in the trapezius.  Neurosensory exam intact.  Imaging:   Xray (cervical spine 4 views): Previous C5-C7 spinal fusion with no evidence of loosening or changes in position compared to previous x-rays.  No acute bony abnormalities.   I personally reviewed and interpreted the radiographs.   Assessment:   52 y.o. female with history of C5-C7  fusion presenting with cervical weakness.  X-rays today are reassuring and do not show any hardware loosening or acute changes.  Based on her presentation and exam today, I believe her symptoms are very likely muscular in nature.  I think she would be a great candidate for physical therapy to help work on a cervical strengthening program.  Patient is agreeable to this however I think she would best benefit from a limited number of sessions with home exercise programs for financial reasons.  She does report today some occasional episodes of what sounds like muscle spasms in the neck but does not like taking Flexeril as it makes her  extremely drowsy.  I will send her in some Robaxin 500 mg that she can try as an alternative.  Patient would also like to establish care somewhere here in town with a spine specialist due to her history, so I will put in a referral to Dr. Christell Constant.  Will plan to see her back as needed.  Plan :    -Referral to physical therapy for cervical strengthening -Referral to Dr. Willia Craze for establishment of care -Return to clinic as needed     I personally saw and evaluated the patient, and participated in the management and treatment plan.  Hazle Nordmann, PA-C Orthopedics  This document was dictated using Conservation officer, historic buildings. A reasonable attempt at proof reading has been made to minimize errors.

## 2023-07-10 ENCOUNTER — Encounter (HOSPITAL_COMMUNITY): Payer: Self-pay

## 2023-07-10 ENCOUNTER — Ambulatory Visit (HOSPITAL_COMMUNITY)
Admission: EM | Admit: 2023-07-10 | Discharge: 2023-07-10 | Disposition: A | Discharge status: Home / Self Care | Payer: 59 | Attending: Internal Medicine | Admitting: Internal Medicine

## 2023-07-10 DIAGNOSIS — R1032 Left lower quadrant pain: Secondary | ICD-10-CM | POA: Diagnosis not present

## 2023-07-10 DIAGNOSIS — N3000 Acute cystitis without hematuria: Secondary | ICD-10-CM | POA: Diagnosis not present

## 2023-07-10 LAB — POCT URINALYSIS DIP (MANUAL ENTRY)
Bilirubin, UA: NEGATIVE
Glucose, UA: NEGATIVE mg/dL
Ketones, POC UA: NEGATIVE mg/dL
Leukocytes, UA: NEGATIVE
Nitrite, UA: NEGATIVE
Protein Ur, POC: NEGATIVE mg/dL
Spec Grav, UA: 1.01 (ref 1.010–1.025)
Urobilinogen, UA: 0.2 E.U./dL
pH, UA: 6 (ref 5.0–8.0)

## 2023-07-10 LAB — CBC WITH DIFFERENTIAL/PLATELET
Abs Immature Granulocytes: 0.03 10*3/uL (ref 0.00–0.07)
Basophils Absolute: 0 10*3/uL (ref 0.0–0.1)
Basophils Relative: 0 %
Eosinophils Absolute: 0.1 10*3/uL (ref 0.0–0.5)
Eosinophils Relative: 1 %
HCT: 36.3 % (ref 36.0–46.0)
Hemoglobin: 11.7 g/dL — ABNORMAL LOW (ref 12.0–15.0)
Immature Granulocytes: 0 %
Lymphocytes Relative: 22 %
Lymphs Abs: 2.1 10*3/uL (ref 0.7–4.0)
MCH: 26.6 pg (ref 26.0–34.0)
MCHC: 32.2 g/dL (ref 30.0–36.0)
MCV: 82.5 fL (ref 80.0–100.0)
Monocytes Absolute: 0.8 10*3/uL (ref 0.1–1.0)
Monocytes Relative: 8 %
Neutro Abs: 6.5 10*3/uL (ref 1.7–7.7)
Neutrophils Relative %: 69 %
Platelets: 344 10*3/uL (ref 150–400)
RBC: 4.4 MIL/uL (ref 3.87–5.11)
RDW: 14 % (ref 11.5–15.5)
WBC: 9.5 10*3/uL (ref 4.0–10.5)
nRBC: 0 % (ref 0.0–0.2)

## 2023-07-10 LAB — COMPREHENSIVE METABOLIC PANEL
ALT: 14 U/L (ref 0–44)
AST: 16 U/L (ref 15–41)
Albumin: 4 g/dL (ref 3.5–5.0)
Alkaline Phosphatase: 77 U/L (ref 38–126)
Anion gap: 10 (ref 5–15)
BUN: 14 mg/dL (ref 6–20)
CO2: 27 mmol/L (ref 22–32)
Calcium: 9.8 mg/dL (ref 8.9–10.3)
Chloride: 98 mmol/L (ref 98–111)
Creatinine, Ser: 0.96 mg/dL (ref 0.44–1.00)
GFR, Estimated: >60 mL/min (ref >60)
Glucose, Bld: 82 mg/dL (ref 70–99)
Potassium: 4 mmol/L (ref 3.5–5.1)
Sodium: 135 mmol/L (ref 135–145)
Total Bilirubin: 0.5 mg/dL (ref 0.3–1.2)
Total Protein: 8.1 g/dL (ref 6.5–8.1)

## 2023-07-10 MED ORDER — NITROFURANTOIN MONOHYD MACRO 100 MG PO CAPS: 100.0000 mg | ORAL_CAPSULE | Freq: Two times a day (BID) | ORAL | 0 refills | Status: DC

## 2023-07-10 MED ORDER — PHENAZOPYRIDINE HCL 95 MG PO TABS: 95.0000 mg | ORAL_TABLET | Freq: Three times a day (TID) | ORAL | 0 refills | Status: DC | PRN

## 2023-07-10 NOTE — ED Provider Notes (Signed)
[facility]    CSN: [id number] Arrival date & time: 07/10/23  1728      History   Chief Complaint Chief Complaint  Patient presents with   Urinary Tract Infection   Abdominal Pain    HPI Theresa Wood is a 52 y.o. female.   Patient presents to clinic for left sided lower abdominal pressure and suprapubic pressure that started today.  She is having urinary urgency and diminished output with pain at the end of urination.  No obvious blood.  No fevers or flank pain.  Denies nausea or vomiting.  Denies changes to vaginal discharge or concern for STIs.  Requesting CBC and CMP, did have labs requested by TM provider on 06/19/23, has not gotten these done yet.     The history is provided by the patient and medical records.  Urinary Tract Infection Associated symptoms: abdominal pain   Associated symptoms: no fever, no nausea, no vaginal discharge and no vomiting   Abdominal Pain Associated symptoms: dysuria   Associated symptoms: no diarrhea, no fever, no hematuria, no nausea, no vaginal discharge and no vomiting     Past Medical History:  Diagnosis Date   Diabetes mellitus without complication (HCC)    Hypertension    Migraine     There are no problems to display for this patient.   Past Surgical History:  Procedure Laterality Date   CERVICAL FUSION  2020   CHOLECYSTECTOMY     gallstone surgery      OB History   No obstetric history on file.      Home Medications    Prior to Admission medications   Medication Sig Start Date End Date Taking? Authorizing Provider  lisinopril-hydrochlorothiazide (ZESTORETIC) 20-25 MG tablet Take by mouth. 12/06/19  Yes [provider]  metFORMIN (GLUCOPHAGE) 1000 MG tablet Take 1,000 mg by mouth 2 (two) times daily. 09/18/21  Yes [provider]  nitrofurantoin, macrocrystal-monohydrate, (MACROBID) 100 MG capsule Take 1 capsule (100 mg total) by mouth 2 (two) times daily. 07/10/23  Yes Rinaldo Ratel, [place]  N, FNP  phenazopyridine (PYRIDIUM) 95 MG tablet Take 1 tablet (95 mg total) by mouth 3 (three) times daily as needed for pain. 07/10/23  Yes Jeanclaude Wentworth, [place] N, FNP    Family History Family History  Problem Relation Age of Onset   Cancer Mother        Cervical     Social History Social History   Tobacco Use   Smoking status: Never   Smokeless tobacco: Never  Vaping Use   Vaping status: Never Used  Substance Use Topics   Alcohol use: Yes    Comment: socially   Drug use: Never     Allergies   Patient has no known allergies.   Review of Systems Review of Systems  Constitutional:  Negative for fever.  Gastrointestinal:  Positive for abdominal pain. Negative for diarrhea, nausea and vomiting.  Genitourinary:  Positive for dysuria, frequency and urgency. Negative for genital sores, hematuria and vaginal discharge.  Musculoskeletal:  Negative for back pain.     Physical Exam Triage Vital Signs ED Triage Vitals  Encounter Vitals Group     BP 07/10/23 1739 127/75     Systolic BP Percentile --      Diastolic BP Percentile --      Pulse Rate 07/10/23 1739 78     Resp 07/10/23 1739 16     Temp 07/10/23 1739 98.8 F (37.1 C)     Temp Source 07/10/23 1739  Oral     SpO2 07/10/23 1739 98 %     Weight 07/10/23 1739 193 lb (87.5 kg)     Height 07/10/23 1739 5\' 3"  (1.6 m)     Head Circumference --      Peak Flow --      Pain Score 07/10/23 1737 6     Pain Loc --      Pain Education --      Exclude from Growth Chart --    No data found.  Updated Vital Signs BP 127/75 (BP Location: Left Arm)   Pulse 78   Temp 98.8 F (37.1 C) (Oral)   Resp 16   Ht 5\' 3"  (1.6 m)   Wt 193 lb (87.5 kg)   SpO2 98%   BMI 34.19 kg/m   Visual Acuity Right Eye Distance:   Left Eye Distance:   Bilateral Distance:    Right Eye Near:   Left Eye Near:    Bilateral Near:     Physical Exam Vitals and nursing note reviewed.  Constitutional:      Appearance: Normal appearance. She is  well-developed.  HENT:     Head: Normocephalic and atraumatic.     Right Ear: External ear normal.     Left Ear: External ear normal.     Nose: Nose normal.     Mouth/Throat:     Mouth: Mucous membranes are moist.  Eyes:     Conjunctiva/sclera: Conjunctivae normal.  Cardiovascular:     Rate and Rhythm: Normal rate.  Pulmonary:     Effort: Pulmonary effort is normal.  Abdominal:     General: Abdomen is flat.     Tenderness: There is abdominal tenderness in the suprapubic area.  Musculoskeletal:        General: Normal range of motion.  Skin:    General: Skin is warm and dry.  Neurological:     General: No focal deficit present.     Mental Status: She is alert and oriented to person, place, and time.  Psychiatric:        Mood and Affect: Mood normal.        Behavior: Behavior normal.      UC Treatments / Results  Labs (all labs ordered are listed, but only abnormal results are displayed) Labs Reviewed  POCT URINALYSIS DIP (MANUAL ENTRY) - Abnormal; Notable for the following components:      Result Value   Blood, UA trace-intact (*)    All other components within normal limits  URINE CULTURE  COMPREHENSIVE METABOLIC PANEL  CBC WITH DIFFERENTIAL/PLATELET    EKG   Radiology No results found.  Procedures Procedures (including critical care time)  Medications Ordered in UC Medications - No data to display  Initial Impression / Assessment and Plan / UC Course  I have reviewed the triage vital signs and the nursing notes.  Pertinent labs & imaging results that were available during my care of the patient were reviewed by me and considered in my medical decision making (see chart for details).  Vitals in triage reviewed, patient is hemodynamically stable.  Suprapubic pain and dysuria, urinalysis with red blood cells.  Will send for culture.  Will go ahead and treat for acute cystitis based off symptoms and red blood cells in urine.  CBC and CMP obtained today in  clinic for abdominal pain and to complete labs requested by TM provider.  Negative for fevers, nausea, vomiting or CVA tenderness, low concern for pyelo. Plan of  care, follow-up care and return precautions given, no questions at this time.    Final Clinical Impressions(s) / UC Diagnoses   Final diagnoses:  Left lower quadrant abdominal pain  Acute cystitis without hematuria     Discharge Instructions      I am starting you on antibiotics for a urinary tract infection. We will call if we need to change your antibiotics based off of your urine culture results. We have checked your labs and will call if anything is critical. You can take AZO as needed for pain or burning. Ensure you are drinking at least 64 ounces of water daily as well to help flush your kidneys.   Return to clinic for new or urgent symptoms.     ED Prescriptions     Medication Sig Dispense Auth. Provider   nitrofurantoin, macrocrystal-monohydrate, (MACROBID) 100 MG capsule Take 1 capsule (100 mg total) by mouth 2 (two) times daily. 10 capsule Jadalee Westcott, [place] N, FNP   phenazopyridine (PYRIDIUM) 95 MG tablet Take 1 tablet (95 mg total) by mouth 3 (three) times daily as needed for pain. 10 tablet Maleeah Crossman, [place] N, FNP      PDMP not reviewed this encounter.   Nathali Vent, [place] N, [place] 07/10/23 [phone]

## 2023-07-10 NOTE — Discharge Instructions (Addendum)
I am starting you on antibiotics for a urinary tract infection. We will call if we need to change your antibiotics based off of your urine culture results. We have checked your labs and will call if anything is critical. You can take AZO as needed for pain or burning. Ensure you are drinking at least 64 ounces of water daily as well to help flush your kidneys.   Return to clinic for new or urgent symptoms.

## 2023-07-10 NOTE — ED Triage Notes (Signed)
Patient having abdominal pain, urinary urgency with decreased output, and getting worse. Onset Tuesday with urgency and pain onset yesterday. Having left buttocks pain as well.

## 2023-07-11 LAB — URINE CULTURE: Culture: <10000 — AB

## 2023-07-20 ENCOUNTER — Ambulatory Visit: Payer: 59 | Admitting: Orthopedic Surgery

## 2023-11-26 ENCOUNTER — Encounter (HOSPITAL_COMMUNITY): Payer: Self-pay

## 2023-11-26 ENCOUNTER — Ambulatory Visit (HOSPITAL_COMMUNITY)
Admission: EM | Admit: 2023-11-26 | Discharge: 2023-11-26 | Disposition: A | Discharge status: Home / Self Care | Payer: 59 | Attending: Family Medicine | Admitting: Family Medicine

## 2023-11-26 DIAGNOSIS — R079 Chest pain, unspecified: Secondary | ICD-10-CM | POA: Diagnosis not present

## 2023-11-26 DIAGNOSIS — R519 Headache, unspecified: Secondary | ICD-10-CM | POA: Diagnosis not present

## 2023-11-26 DIAGNOSIS — R0602 Shortness of breath: Secondary | ICD-10-CM | POA: Diagnosis present

## 2023-11-26 DIAGNOSIS — I10 Essential (primary) hypertension: Secondary | ICD-10-CM | POA: Insufficient documentation

## 2023-11-26 LAB — COMPREHENSIVE METABOLIC PANEL
ALT: 13 U/L (ref 0–44)
AST: 13 U/L — ABNORMAL LOW (ref 15–41)
Albumin: 4 g/dL (ref 3.5–5.0)
Alkaline Phosphatase: 79 U/L (ref 38–126)
Anion gap: 7 (ref 5–15)
BUN: 12 mg/dL (ref 6–20)
CO2: 27 mmol/L (ref 22–32)
Calcium: 9.8 mg/dL (ref 8.9–10.3)
Chloride: 102 mmol/L (ref 98–111)
Creatinine, Ser: 0.94 mg/dL (ref 0.44–1.00)
GFR, Estimated: >60 mL/min (ref >60)
Glucose, Bld: 92 mg/dL (ref 70–99)
Potassium: 3.9 mmol/L (ref 3.5–5.1)
Sodium: 136 mmol/L (ref 135–145)
Total Bilirubin: 0.8 mg/dL (ref 0.0–1.2)
Total Protein: 7.8 g/dL (ref 6.5–8.1)

## 2023-11-26 LAB — POCT URINALYSIS DIP (MANUAL ENTRY)
Glucose, UA: NEGATIVE mg/dL
Ketones, POC UA: NEGATIVE mg/dL
Nitrite, UA: NEGATIVE
Protein Ur, POC: 30 mg/dL — AB
Spec Grav, UA: 1.015 (ref 1.010–1.025)
Urobilinogen, UA: 0.2 U/dL
pH, UA: 6 (ref 5.0–8.0)

## 2023-11-26 LAB — CBC WITH DIFFERENTIAL/PLATELET
Abs Immature Granulocytes: 0.02 10*3/uL (ref 0.00–0.07)
Basophils Absolute: 0 10*3/uL (ref 0.0–0.1)
Basophils Relative: 1 %
Eosinophils Absolute: 0.2 10*3/uL (ref 0.0–0.5)
Eosinophils Relative: 3 %
HCT: 35.8 % — ABNORMAL LOW (ref 36.0–46.0)
Hemoglobin: 11.7 g/dL — ABNORMAL LOW (ref 12.0–15.0)
Immature Granulocytes: 0 %
Lymphocytes Relative: 25 %
Lymphs Abs: 1.6 10*3/uL (ref 0.7–4.0)
MCH: 27 pg (ref 26.0–34.0)
MCHC: 32.7 g/dL (ref 30.0–36.0)
MCV: 82.5 fL (ref 80.0–100.0)
Monocytes Absolute: 0.5 10*3/uL (ref 0.1–1.0)
Monocytes Relative: 9 %
Neutro Abs: 3.9 10*3/uL (ref 1.7–7.7)
Neutrophils Relative %: 62 %
Platelets: 333 10*3/uL (ref 150–400)
RBC: 4.34 MIL/uL (ref 3.87–5.11)
RDW: 13.8 % (ref 11.5–15.5)
WBC: 6.2 10*3/uL (ref 4.0–10.5)
nRBC: 0 % (ref 0.0–0.2)

## 2023-11-26 LAB — TROPONIN I (HIGH SENSITIVITY): Troponin I (High Sensitivity): <2 ng/L (ref <18)

## 2023-11-26 LAB — D-DIMER, QUANTITATIVE: D-Dimer, Quant: <0.27 ug{FEU}/mL (ref 0.00–0.50)

## 2023-11-26 LAB — POCT FASTING CBG KUC MANUAL ENTRY: POCT Glucose (KUC): 97 mg/dL (ref 70–99)

## 2023-11-26 NOTE — Discharge Instructions (Signed)
You were seen today for not feeling well.  Your blood pressure and vitals are normal here today. Your blood sugar and urine looked okay.  Your EKG did not show any emergent changes.  However, I have ordered blood work for further testing.  This will be resulted later today and I will will notify you of results.  You should follow up with your primary care provider as well in terms of better control of your blood pressure.  If you have continued elevated blood pressure despite medication, along with chest pain, shortness of breath, then please go directly to the ER.

## 2023-11-26 NOTE — ED Provider Notes (Signed)
[facility]    CSN: [id number] Arrival date & time: 11/26/23  0810      History   Chief Complaint Chief Complaint  Patient presents with   Hypertension   Dizziness   Headache    HPI Theresa Wood is a 53 y.o. female.    Hypertension Associated symptoms include chest pain, headaches and shortness of breath.  Dizziness Associated symptoms: chest pain, headaches and shortness of breath   Headache Associated symptoms: dizziness and fatigue   Associated symptoms: no congestion, no fever and no sore throat    Starting about a week ago she started feeling a bit "off".  Yesterday she felt more light headed and dizzy. Her home bp was 170/90.  She ended up taking another dose of her bp medication (1/2 tab).  This morning her bp was 195/100.  Then 171/80.  She took her normal dose of bp medication after that.  Her bp is normal now. However, she states her bp is usually lower to 115/70.  She states she continues to not feel well.  She feels "weak" and off.  She  does not have a headache, but her head feels "heavy".  No n/v.  She has blurry vision off/on anyway, but nothing new/different.  Mild chest pain yesterday, mild sob today.  She is perimenopausal, she has not had a period in 10 months, but started her period this week.        Past Medical History:  Diagnosis Date   Diabetes mellitus without complication (HCC)    Hypertension    Migraine     There are no active problems to display for this patient.   Past Surgical History:  Procedure Laterality Date   CERVICAL FUSION  2020   CHOLECYSTECTOMY     gallstone surgery      OB History   No obstetric history on file.      Home Medications    Prior to Admission medications   Medication Sig Start Date End Date Taking? Authorizing Provider  lisinopril-hydrochlorothiazide (ZESTORETIC) 20-25 MG tablet Take by mouth. 12/06/19   [provider]  metFORMIN (GLUCOPHAGE) 1000 MG tablet Take 1,000 mg  by mouth 2 (two) times daily. 09/18/21   [provider]  nitrofurantoin, macrocrystal-monohydrate, (MACROBID) 100 MG capsule Take 1 capsule (100 mg total) by mouth 2 (two) times daily. 07/10/23   Garrison, [place] N, FNP  phenazopyridine (PYRIDIUM) 95 MG tablet Take 1 tablet (95 mg total) by mouth 3 (three) times daily as needed for pain. 07/10/23   Garrison, [place] N, FNP    Family History Family History  Problem Relation Age of Onset   Cancer Mother        Cervical     Social History Social History   Tobacco Use   Smoking status: Never   Smokeless tobacco: Never  Vaping Use   Vaping status: Never Used  Substance Use Topics   Alcohol use: Yes    Comment: socially   Drug use: Never     Allergies   Patient has no known allergies.   Review of Systems Review of Systems  Constitutional:  Positive for fatigue. Negative for activity change, appetite change, chills and fever.  HENT:  Negative for congestion, rhinorrhea and sore throat.   Respiratory:  Positive for shortness of breath.   Cardiovascular:  Positive for chest pain.  Gastrointestinal: Negative.   Genitourinary: Negative.   Musculoskeletal: Negative.   Neurological:  Positive for dizziness and headaches.  Hematological: Negative.  Psychiatric/Behavioral: Negative.       Physical Exam Triage Vital Signs ED Triage Vitals [11/26/23 0848]  Encounter Vitals Group     BP 137/72     Systolic BP Percentile      Diastolic BP Percentile      Pulse Rate 72     Resp 16     Temp 98.5 F (36.9 C)     Temp Source Oral     SpO2 98 %     Weight      Height      Head Circumference      Peak Flow      Pain Score 6     Pain Loc      Pain Education      Exclude from Growth Chart    No data found.  Updated Vital Signs BP 137/72 (BP Location: Left Arm)   Pulse 72   Temp 98.5 F (36.9 C) (Oral)   Resp 16   LMP 11/24/2023   SpO2 98%   Visual Acuity Right Eye Distance:   Left Eye Distance:    Bilateral Distance:    Right Eye Near:   Left Eye Near:    Bilateral Near:     Physical Exam Constitutional:      General: She is not in acute distress.    Appearance: She is well-developed and normal weight. She is not ill-appearing or toxic-appearing.  HENT:     Head: Normocephalic and atraumatic.     Mouth/Throat:     Mouth: Mucous membranes are moist.  Cardiovascular:     Rate and Rhythm: Normal rate and regular rhythm.     Heart sounds: Normal heart sounds.  Pulmonary:     Effort: Pulmonary effort is normal.     Breath sounds: Normal breath sounds.  Abdominal:     General: Bowel sounds are normal.     Palpations: Abdomen is soft.     Tenderness: There is no abdominal tenderness. There is no guarding.  Musculoskeletal:        General: Normal range of motion.     Cervical back: Normal range of motion and neck supple. No rigidity.  Lymphadenopathy:     Cervical: No cervical adenopathy.  Skin:    General: Skin is warm and dry.  Neurological:     Mental Status: She is alert and oriented to person, place, and time.  Psychiatric:        Mood and Affect: Mood normal.        Behavior: Behavior normal.      UC Treatments / Results  Labs (all labs ordered are listed, but only abnormal results are displayed) Labs Reviewed  CBC WITH DIFFERENTIAL/PLATELET - Abnormal; Notable for the following components:      Result Value   Hemoglobin 11.7 (*)    HCT 35.8 (*)    All other components within normal limits  COMPREHENSIVE METABOLIC PANEL - Abnormal; Notable for the following components:   AST 13 (*)    All other components within normal limits  POCT URINALYSIS DIP (MANUAL ENTRY) - Abnormal; Notable for the following components:   Color, UA red (*)    Clarity, UA cloudy (*)    Bilirubin, UA moderate (*)    Blood, UA large (*)    Protein Ur, POC =30 (*)    Leukocytes, UA Trace (*)    All other components within normal limits  POCT FASTING CBG KUC MANUAL ENTRY - Normal   D-DIMER, QUANTITATIVE  TROPONIN I (HIGH SENSITIVITY)    EKG NSR;  non specific ST/T waves changes compared to previous  Radiology No results found.  Procedures Procedures (including critical care time)  Medications Ordered in UC Medications - No data to display  Initial Impression / Assessment and Plan / UC Course  I have reviewed the triage vital signs and the nursing notes.  Pertinent labs & imaging results that were available during my care of the patient were reviewed by me and considered in my medical decision making (see chart for details).    Final Clinical Impressions(s) / UC Diagnoses   Final diagnoses:  Primary hypertension  Nonintractable headache, unspecified chronicity pattern, unspecified headache type  Chest pain, unspecified type  SOB (shortness of breath)     Discharge Instructions      You were seen today for not feeling well.  Your blood pressure and vitals are normal here today. Your blood sugar and urine looked okay.  Your EKG did not show any emergent changes.  However, I have ordered blood work for further testing.  This will be resulted later today and I will will notify you of results.  You should follow up with your primary care provider as well in terms of better control of your blood pressure.  If you have continued elevated blood pressure despite medication, along with chest pain, shortness of breath, then please go directly to the ER.       Stat blood work was completed;  Troponin negative, d-dimer negative, and otherwise no concern.  Patient was called/made aware of results;  She will follow up with her primary care provider for further care.  ED Prescriptions   None    PDMP not reviewed this encounter.   Jannifer Franklin, MD 11/26/23 1140

## 2023-11-26 NOTE — ED Triage Notes (Signed)
Patient c/o headache, dizziness, and hypertension since. Patient states her BP was 147/90 and 171/100. Patient states she took 1/2 of her BP med extra (beyond prescribed dose) Patient states she has taken her BP medication today.

## 2024-04-11 ENCOUNTER — Encounter (HOSPITAL_COMMUNITY): Payer: Self-pay | Admitting: Emergency Medicine

## 2024-04-11 ENCOUNTER — Other Ambulatory Visit: Payer: Self-pay

## 2024-04-11 ENCOUNTER — Ambulatory Visit (HOSPITAL_COMMUNITY)
Admission: EM | Admit: 2024-04-11 | Discharge: 2024-04-11 | Disposition: A | Discharge status: Home / Self Care | Attending: Emergency Medicine | Admitting: Emergency Medicine

## 2024-04-11 DIAGNOSIS — H65193 Other acute nonsuppurative otitis media, bilateral: Secondary | ICD-10-CM | POA: Diagnosis not present

## 2024-04-11 DIAGNOSIS — R079 Chest pain, unspecified: Secondary | ICD-10-CM | POA: Diagnosis not present

## 2024-04-11 DIAGNOSIS — R0789 Other chest pain: Secondary | ICD-10-CM | POA: Diagnosis not present

## 2024-04-11 DIAGNOSIS — R519 Headache, unspecified: Secondary | ICD-10-CM | POA: Diagnosis not present

## 2024-04-11 LAB — COMPREHENSIVE METABOLIC PANEL WITH GFR
ALT: 15 U/L (ref 0–44)
AST: 16 U/L (ref 15–41)
Albumin: 4.2 g/dL (ref 3.5–5.0)
Alkaline Phosphatase: 89 U/L (ref 38–126)
Anion gap: 11 (ref 5–15)
BUN: 14 mg/dL (ref 6–20)
CO2: 24 mmol/L (ref 22–32)
Calcium: 9.8 mg/dL (ref 8.9–10.3)
Chloride: 98 mmol/L (ref 98–111)
Creatinine, Ser: 0.97 mg/dL (ref 0.44–1.00)
GFR, Estimated: >60 mL/min (ref >60)
Glucose, Bld: 83 mg/dL (ref 70–99)
Potassium: 3.6 mmol/L (ref 3.5–5.1)
Sodium: 133 mmol/L — ABNORMAL LOW (ref 135–145)
Total Bilirubin: 0.7 mg/dL (ref 0.0–1.2)
Total Protein: 8.1 g/dL (ref 6.5–8.1)

## 2024-04-11 LAB — CBC WITH DIFFERENTIAL/PLATELET
Abs Immature Granulocytes: 0.04 10*3/uL (ref 0.00–0.07)
Basophils Absolute: 0 10*3/uL (ref 0.0–0.1)
Basophils Relative: 0 %
Eosinophils Absolute: 0.1 10*3/uL (ref 0.0–0.5)
Eosinophils Relative: 1 %
HCT: 34.6 % — ABNORMAL LOW (ref 36.0–46.0)
Hemoglobin: 11.3 g/dL — ABNORMAL LOW (ref 12.0–15.0)
Immature Granulocytes: 0 %
Lymphocytes Relative: 28 %
Lymphs Abs: 3 10*3/uL (ref 0.7–4.0)
MCH: 26.4 pg (ref 26.0–34.0)
MCHC: 32.7 g/dL (ref 30.0–36.0)
MCV: 80.8 fL (ref 80.0–100.0)
Monocytes Absolute: 0.8 10*3/uL (ref 0.1–1.0)
Monocytes Relative: 7 %
Neutro Abs: 6.9 10*3/uL (ref 1.7–7.7)
Neutrophils Relative %: 64 %
Platelets: 386 10*3/uL (ref 150–400)
RBC: 4.28 MIL/uL (ref 3.87–5.11)
RDW: 13.6 % (ref 11.5–15.5)
WBC: 10.9 10*3/uL — ABNORMAL HIGH (ref 4.0–10.5)
nRBC: 0 % (ref 0.0–0.2)

## 2024-04-11 NOTE — ED Triage Notes (Signed)
 Complains of headache and bilateral ear muffled sounds intermittently.  Patient complains of chest pain.  Describes head pain as throbbing when standing, not when lying down or sitting.  Patient also complains of intermittent chest pain with movement or reaching for something or lying on left side too long.  Has taken tylenol .    Patient describes these symptoms as having occurred in the past.

## 2024-04-11 NOTE — Discharge Instructions (Signed)
  1. Acute intractable headache, unspecified headache type (Primary) 2. Chest wall pain - ED EKG completed in UC shows normal sinus rhythm with a ventricular rate of 71 bpm, no STEMI. - CBC with Differential collected in UC and sent to lab for further testing - Comprehensive metabolic panel collected in UC and sent to lab for further testing results should be available in 1 to 2 days.  3. Acute middle ear effusion, bilateral - Take ibuprofen , antihistamine medication or use over-the-counter nasal spray to clear middle ear effusion. - Most of the time middle ear effusion resolves on its own without any treatment. -Continue to monitor symptoms for any change in severity if there is any escalation of current symptoms or development of new symptoms follow-up in ER for further evaluation and management.

## 2024-04-11 NOTE — ED Provider Notes (Signed)
 [facility] [place]  Note:  This document was prepared using Dragon voice recognition software and may include unintentional dictation errors.  MRN: [id number] DOB: [birth year]  Subjective:   Theresa Wood is a 53 y.o. female presenting for headache, left-sided chest pain with movement, muffled hearing to bilateral ears.  Patient states that headache becomes worse when standing and chest pain becomes worse when moving.  Patient is taken Tylenol  with minimal improvement of symptoms.  Patient reports that she has had the similar symptoms in the past and was evaluated in the ER but no answers were found to what was causing her symptoms.  Patient states if she lies on her left side for too long she feels chest pain and has to roll to her other side.  Patient denies any vision changes, altered mental status, dizziness, weakness, fatigue.  No current facility-administered medications for this encounter.  Current Outpatient Medications:    atorvastatin (LIPITOR) 20 MG tablet, Take 20 mg by mouth daily., Disp: , Rfl:    lisinopril-hydrochlorothiazide (ZESTORETIC) 20-25 MG tablet, Take by mouth., Disp: , Rfl:    metFORMIN (GLUCOPHAGE) 1000 MG tablet, Take 1,000 mg by mouth 2 (two) times daily., Disp: , Rfl:    No Known Allergies  Past Medical History:  Diagnosis Date   Diabetes mellitus without complication (HCC)    Hypertension    Migraine      Past Surgical History:  Procedure Laterality Date   CERVICAL FUSION  2020   CHOLECYSTECTOMY     gallstone surgery      Family History  Problem Relation Age of Onset   Cancer Mother        Cervical     Social History   Tobacco Use   Smoking status: Never   Smokeless tobacco: Never  Vaping Use   Vaping status: Never Used  Substance Use Topics   Alcohol use: Yes    Comment: socially   Drug use: Never    ROS Refer to HPI for ROS details.  Objective:   Vitals: BP 134/73 (BP Location: Right Arm)   Pulse 74   Temp 98.2 F  (36.8 C) (Oral)   Resp 20   SpO2 98%   Physical Exam Vitals and nursing note reviewed.  Constitutional:      General: She is not in acute distress.    Appearance: Normal appearance. She is well-developed. She is not ill-appearing or toxic-appearing.  HENT:     Head: Normocephalic and atraumatic.     Right Ear: Ear canal and external ear normal. A middle ear effusion is present. There is no impacted cerumen. Tympanic membrane is not injected, erythematous or bulging.     Left Ear: Ear canal and external ear normal. A middle ear effusion is present. There is no impacted cerumen. Tympanic membrane is not injected, erythematous or bulging.     Nose: Congestion present. No rhinorrhea.     Mouth/Throat:     Mouth: Mucous membranes are moist.     Pharynx: Oropharynx is clear. No oropharyngeal exudate or posterior oropharyngeal erythema.  Eyes:     General:        Right eye: No discharge.        Left eye: No discharge.     Extraocular Movements: Extraocular movements intact.     Conjunctiva/sclera: Conjunctivae normal.     Pupils: Pupils are equal, round, and reactive to light.  Cardiovascular:     Rate and Rhythm: Normal rate and regular rhythm.  Heart sounds: Normal heart sounds. No murmur heard. Pulmonary:     Effort: Pulmonary effort is normal. No respiratory distress.     Breath sounds: Normal breath sounds. No stridor. No wheezing, rhonchi or rales.  Skin:    General: Skin is warm and dry.  Neurological:     General: No focal deficit present.     Mental Status: She is alert and oriented to person, place, and time.  Psychiatric:        Mood and Affect: Mood normal.        Behavior: Behavior normal.     Procedures  No results found for this or any previous visit (from the past 24 hours).  No results found.   Assessment and Plan :     Discharge Instructions       1. Acute intractable headache, unspecified headache type (Primary) 2. Chest wall pain - ED EKG  completed in UC shows normal sinus rhythm with a ventricular rate of 71 bpm, no STEMI. - CBC with Differential collected in UC and sent to lab for further testing - Comprehensive metabolic panel collected in UC and sent to lab for further testing results should be available in 1 to 2 days.  3. Acute middle ear effusion, bilateral - Take ibuprofen , antihistamine medication or use over-the-counter nasal spray to clear middle ear effusion. - Most of the time middle ear effusion resolves on its own without any treatment. -Continue to monitor symptoms for any change in severity if there is any escalation of current symptoms or development of new symptoms follow-up in ER for further evaluation and management.    Jadee Golebiewski B Myrtice Lowdermilk   [place], [place], [place] 04/11/24 2048

## 2024-04-12 ENCOUNTER — Ambulatory Visit (HOSPITAL_COMMUNITY): Payer: Self-pay

## 2024-08-08 ENCOUNTER — Other Ambulatory Visit: Payer: Self-pay

## 2024-08-08 ENCOUNTER — Encounter (HOSPITAL_COMMUNITY): Payer: Self-pay

## 2024-08-08 ENCOUNTER — Emergency Department (HOSPITAL_COMMUNITY)
Admission: EM | Admit: 2024-08-08 | Discharge: 2024-08-09 | Disposition: A | Discharge status: Home / Self Care | Attending: Emergency Medicine | Admitting: Emergency Medicine

## 2024-08-08 DIAGNOSIS — D72829 Elevated white blood cell count, unspecified: Secondary | ICD-10-CM | POA: Diagnosis not present

## 2024-08-08 DIAGNOSIS — R0789 Other chest pain: Secondary | ICD-10-CM | POA: Diagnosis present

## 2024-08-08 DIAGNOSIS — Z79899 Other long term (current) drug therapy: Secondary | ICD-10-CM | POA: Diagnosis not present

## 2024-08-08 DIAGNOSIS — I10 Essential (primary) hypertension: Secondary | ICD-10-CM | POA: Diagnosis not present

## 2024-08-08 NOTE — ED Triage Notes (Signed)
 Pt arrived via POV c/o left sided chest pain described as sharp, HTN, and bilateral jaws burning. 5/10 on pain scale.

## 2024-08-09 ENCOUNTER — Other Ambulatory Visit (HOSPITAL_COMMUNITY)

## 2024-08-09 LAB — BASIC METABOLIC PANEL WITH GFR
Anion gap: 10 (ref 5–15)
BUN: 15 mg/dL (ref 6–20)
CO2: 26 mmol/L (ref 22–32)
Calcium: 9.9 mg/dL (ref 8.9–10.3)
Chloride: 102 mmol/L (ref 98–111)
Creatinine, Ser: 0.89 mg/dL (ref 0.44–1.00)
GFR, Estimated: >60 mL/min (ref >60)
Glucose, Bld: 97 mg/dL (ref 70–99)
Potassium: 3.7 mmol/L (ref 3.5–5.1)
Sodium: 138 mmol/L (ref 135–145)

## 2024-08-09 LAB — TROPONIN I (HIGH SENSITIVITY)
Troponin I (High Sensitivity): <2 ng/L (ref <18)
Troponin I (High Sensitivity): <2 ng/L (ref <18)

## 2024-08-09 LAB — CBC
HCT: 35.8 % — ABNORMAL LOW (ref 36.0–46.0)
Hemoglobin: 11.5 g/dL — ABNORMAL LOW (ref 12.0–15.0)
MCH: 26.4 pg (ref 26.0–34.0)
MCHC: 32.1 g/dL (ref 30.0–36.0)
MCV: 82.3 fL (ref 80.0–100.0)
Platelets: 353 K/uL (ref 150–400)
RBC: 4.35 MIL/uL (ref 3.87–5.11)
RDW: 14.3 % (ref 11.5–15.5)
WBC: 11.2 K/uL — ABNORMAL HIGH (ref 4.0–10.5)
nRBC: 0 % (ref 0.0–0.2)

## 2024-08-09 MED ORDER — ACETAMINOPHEN 325 MG PO TABS
650.0000 mg | ORAL_TABLET | Freq: Once | ORAL | Status: AC
Start: 2024-08-09 — End: 2024-08-09
  Administered 2024-08-09: 650 mg via ORAL
  Filled 2024-08-09: qty 2

## 2024-08-09 NOTE — ED Notes (Signed)
 Pt ambulated to room with a steady gait and no signs of distress.

## 2024-08-09 NOTE — ED Provider Notes (Signed)
 [place] EMERGENCY DEPARTMENT AT [facility] Provider Note   CSN: [id number] Arrival date & time: 08/08/24  2259     Patient presents with: Chest Pain   Theresa Wood is a 53 y.o. female.   The history is provided by the patient.  Chest Pain Theresa Wood is a 53 y.o. female who presents to the Emergency Department complaining of chest pain. She presents the emergency department for evaluation of 1 to 2 weeks of intermittently high blood pressure and intermittent chest pain. This evening when she went to lay down she developed burning in her jaw bilaterally. In terms of her chest pain this is when she moves. Overall her symptoms are resolved. No associate diaphoresis, shortness of breath. Sometimes when she talks a lot she has to pause to catch her breath. No associated leg swelling or pain. She has a history of hypertension and takes lisinopril HCTZ. She scheduled follow-up with cardiology in November.      Prior to Admission medications   Medication Sig Start Date End Date Taking? Authorizing Provider  atorvastatin (LIPITOR) 20 MG tablet Take 20 mg by mouth daily.    [provider]  lisinopril-hydrochlorothiazide (ZESTORETIC) 20-25 MG tablet Take by mouth. 12/06/19   [provider]  metFORMIN (GLUCOPHAGE) 1000 MG tablet Take 1,000 mg by mouth 2 (two) times daily. 09/18/21   [provider]    Allergies: Patient has no known allergies.    Review of Systems  Cardiovascular:  Positive for chest pain.  All other systems reviewed and are negative.   Updated Vital Signs BP 139/70   Pulse 65   Temp (!) 97.3 F (36.3 C) (Oral)   Resp 15   Ht 5' 3 (1.6 m)   Wt 95.3 kg   LMP 11/11/2023 (Approximate)   SpO2 100%   BMI 37.20 kg/m   Physical Exam Vitals and nursing note reviewed.  Constitutional:      Appearance: She is well-developed.  HENT:     Head: Normocephalic and atraumatic.  Cardiovascular:     Rate and Rhythm: Normal rate and  regular rhythm.     Heart sounds: No murmur heard. Pulmonary:     Effort: Pulmonary effort is normal. No respiratory distress.     Breath sounds: Normal breath sounds.  Abdominal:     Palpations: Abdomen is soft.     Tenderness: There is no abdominal tenderness. There is no guarding or rebound.  Musculoskeletal:        General: No swelling or tenderness.  Skin:    General: Skin is warm and dry.  Neurological:     Mental Status: She is alert and oriented to person, place, and time.  Psychiatric:        Behavior: Behavior normal.     (all labs ordered are listed, but only abnormal results are displayed) Labs Reviewed  CBC - Abnormal; Notable for the following components:      Result Value   WBC 11.2 (*)    Hemoglobin 11.5 (*)    HCT 35.8 (*)    All other components within normal limits  BASIC METABOLIC PANEL WITH GFR  TROPONIN I (HIGH SENSITIVITY)  TROPONIN I (HIGH SENSITIVITY)    EKG: EKG Interpretation Date/Time:  Monday August 08 2024 23:55:31 EDT Ventricular Rate:  72 PR Interval:  166 QRS Duration:  86 QT Interval:  382 QTC Calculation: 418 R Axis:   100  Text Interpretation: Normal sinus rhythm with sinus arrhythmia Rightward axis Nonspecific T  wave abnormality Abnormal ECG Confirmed by Griselda Norris [phone]) on 08/08/2024 11:58:31 PM  Radiology: DG Chest 2 View Result Date: 08/09/2024 EXAM: 2 VIEW(S) XRAY OF THE CHEST 08/09/2024 12:09:00 AM COMPARISON: Comparison with x-ray 03/01/2021. CLINICAL HISTORY: chest pain. Left sided chest pain x2 days. Worse when lying down. FINDINGS: LUNGS AND PLEURA: No focal pulmonary opacity. No pulmonary edema. No pleural effusion. No pneumothorax. HEART AND MEDIASTINUM: No acute abnormality of the cardiac and mediastinal silhouettes. BONES AND SOFT TISSUES: Right convex scoliosis. IMPRESSION: 1. No acute cardiopulmonary abnormality. Electronically signed by: Norman Gatlin MD 08/09/2024 12:13 AM EDT RP Workstation: [id number]      Procedures   Medications Ordered in the ED  acetaminophen  (TYLENOL ) tablet 650 mg (650 mg Oral Given 08/09/24 0145)                                    Medical Decision Making Amount and/or Complexity of Data Reviewed Labs: ordered. Radiology: ordered.  Risk OTC drugs.   Patient with history of hypertension here for evaluation of intermittent chest pain that is reproducible with movement, burning in her jaw with elevated blood pressures at home ranging from 140 to 170. EKG is not ischemic and troponins are negative times two. Her blood pressure is improved in the emergency department. Chest x-ray is without acute abnormality. CBC with mild leukocytosis, otherwise unremarkable. Current picture is not consistent with hypertensive urgency, PE, dissection, ACS. Discussed with patient homecare for chest pain as well as elevated blood pressure. She has been eating salty and spicy food the last week, this may be driving her blood pressure up. Discussed diet changes. Discussed outpatient follow-up and return precautions.     Final diagnoses:  Atypical chest pain    ED Discharge Orders     None          Griselda Norris, MD 08/09/24 [id number]

## 2024-08-21 ENCOUNTER — Emergency Department (HOSPITAL_COMMUNITY)
Admission: EM | Admit: 2024-08-21 | Discharge: 2024-08-21 | Disposition: A | Discharge status: Home / Self Care | Attending: Emergency Medicine | Admitting: Emergency Medicine

## 2024-08-21 ENCOUNTER — Emergency Department (HOSPITAL_COMMUNITY)

## 2024-08-21 ENCOUNTER — Other Ambulatory Visit: Payer: Self-pay

## 2024-08-21 DIAGNOSIS — R519 Headache, unspecified: Secondary | ICD-10-CM | POA: Insufficient documentation

## 2024-08-21 DIAGNOSIS — R1013 Epigastric pain: Secondary | ICD-10-CM | POA: Insufficient documentation

## 2024-08-21 DIAGNOSIS — Z79899 Other long term (current) drug therapy: Secondary | ICD-10-CM | POA: Insufficient documentation

## 2024-08-21 DIAGNOSIS — Z7984 Long term (current) use of oral hypoglycemic drugs: Secondary | ICD-10-CM | POA: Insufficient documentation

## 2024-08-21 DIAGNOSIS — I10 Essential (primary) hypertension: Secondary | ICD-10-CM | POA: Insufficient documentation

## 2024-08-21 DIAGNOSIS — E119 Type 2 diabetes mellitus without complications: Secondary | ICD-10-CM | POA: Insufficient documentation

## 2024-08-21 LAB — CBC
HCT: 34 % — ABNORMAL LOW (ref 36.0–46.0)
Hemoglobin: 10.9 g/dL — ABNORMAL LOW (ref 12.0–15.0)
MCH: 26 pg (ref 26.0–34.0)
MCHC: 32.1 g/dL (ref 30.0–36.0)
MCV: 81 fL (ref 80.0–100.0)
Platelets: 347 K/uL (ref 150–400)
RBC: 4.2 MIL/uL (ref 3.87–5.11)
RDW: 13.8 % (ref 11.5–15.5)
WBC: 7.9 K/uL (ref 4.0–10.5)
nRBC: 0 % (ref 0.0–0.2)

## 2024-08-21 LAB — COMPREHENSIVE METABOLIC PANEL WITH GFR
ALT: 17 U/L (ref 0–44)
AST: 19 U/L (ref 15–41)
Albumin: 4 g/dL (ref 3.5–5.0)
Alkaline Phosphatase: 91 U/L (ref 38–126)
Anion gap: 11 (ref 5–15)
BUN: 19 mg/dL (ref 6–20)
CO2: 24 mmol/L (ref 22–32)
Calcium: 9.7 mg/dL (ref 8.9–10.3)
Chloride: 102 mmol/L (ref 98–111)
Creatinine, Ser: 1.01 mg/dL — ABNORMAL HIGH (ref 0.44–1.00)
GFR, Estimated: >60 mL/min (ref >60)
Glucose, Bld: 113 mg/dL — ABNORMAL HIGH (ref 70–99)
Potassium: 3.7 mmol/L (ref 3.5–5.1)
Sodium: 137 mmol/L (ref 135–145)
Total Bilirubin: 0.5 mg/dL (ref 0.0–1.2)
Total Protein: 7.7 g/dL (ref 6.5–8.1)

## 2024-08-21 LAB — URINALYSIS, ROUTINE W REFLEX MICROSCOPIC
Bilirubin Urine: NEGATIVE
Glucose, UA: NEGATIVE mg/dL
Hgb urine dipstick: NEGATIVE
Ketones, ur: NEGATIVE mg/dL
Leukocytes,Ua: NEGATIVE
Nitrite: NEGATIVE
Protein, ur: NEGATIVE mg/dL
Specific Gravity, Urine: 1.017 (ref 1.005–1.030)
pH: 5 (ref 5.0–8.0)

## 2024-08-21 LAB — LIPASE, BLOOD: Lipase: 44 U/L (ref 11–51)

## 2024-08-21 LAB — LIPID PANEL
Cholesterol: 168 mg/dL (ref 0–200)
HDL: 48 mg/dL (ref >40)
LDL Cholesterol: 86 mg/dL (ref 0–99)
Total CHOL/HDL Ratio: 3.5 ratio
Triglycerides: 171 mg/dL — ABNORMAL HIGH (ref <150)
VLDL: 34 mg/dL (ref 0–40)

## 2024-08-21 MED ORDER — DIPHENHYDRAMINE HCL 25 MG PO CAPS
25.0000 mg | ORAL_CAPSULE | Freq: Once | ORAL | Status: AC
  Administered 2024-08-21: 25 mg via ORAL
  Filled 2024-08-21: qty 1

## 2024-08-21 MED ORDER — FAMOTIDINE 20 MG PO TABS: 20.0000 mg | ORAL_TABLET | Freq: Two times a day (BID) | ORAL | 0 refills | Status: AC

## 2024-08-21 MED ORDER — METOCLOPRAMIDE HCL 10 MG PO TABS
10.0000 mg | ORAL_TABLET | Freq: Once | ORAL | Status: AC
  Administered 2024-08-21: 10 mg via ORAL
  Filled 2024-08-21: qty 1

## 2024-08-21 MED ORDER — IBUPROFEN 400 MG PO TABS
600.0000 mg | ORAL_TABLET | Freq: Once | ORAL | Status: AC
  Administered 2024-08-21: 600 mg via ORAL
  Filled 2024-08-21: qty 1

## 2024-08-21 NOTE — Discharge Instructions (Signed)
 Based on your description of the abdominal pain you are experiencing I recommend that you take Pepcid twice daily.  I recommend drinking as little alcohol and taking as few NSAIDs as possible this means mostly avoiding ibuprofen  although this can be helpful for headaches and I did give you a dose of this today.  Ultimately I wonder if you perhaps have some gastritis which is irritation of the stomach lining.  Certainly would like you to follow-up with your primary care doctor and they may refer you to ENT for further evaluation of the pulsatile tinnitus that you are experiencing.

## 2024-08-21 NOTE — ED Notes (Signed)
 Patient discharged to home with self. Patient given written and oral discharge instructions. Patient verbalized understanding. Patient ambulatory to exit with steady gait. Patient breathing without difficulty. Patient denies questions, concerns or  needs at this time.

## 2024-08-21 NOTE — ED Provider Notes (Signed)
 [place] EMERGENCY DEPARTMENT AT [facility] Provider Note   CSN: [id number] Arrival date & time: 08/21/24  9675     Patient presents with: Abdominal Pain   Theresa Wood is a 53 y.o. female.    Abdominal Pain  Patient is a 53 year old female with a past medical history significant for hypertension diabetes migraine  She presents emergency room today with complaints of epigastric abdominal pain since yesterday she denies any nausea vomiting or diarrhea she denies any chest pain or difficulty breathing.  No fevers or chills negative frequency urgency dysuria hematuria.  She also had as that she has had a headache for 2 weeks although she indicates that this has been rare and intermittent bitemporal headache she states that today she has a headache that is more frontal achy without any numbness weakness slurred speech confusion she is not on any anticoagulation, no head injuries.  She is status post cholecystectomy years ago.    Prior to Admission medications   Medication Sig Start Date End Date Taking? Authorizing Provider  famotidine (PEPCID) 20 MG tablet Take 1 tablet (20 mg total) by mouth 2 (two) times daily. 08/21/24  Yes Fiona Coto S, PA  atorvastatin (LIPITOR) 20 MG tablet Take 20 mg by mouth daily.    [provider]  lisinopril-hydrochlorothiazide (ZESTORETIC) 20-25 MG tablet Take by mouth. 12/06/19   [provider]  metFORMIN (GLUCOPHAGE) 1000 MG tablet Take 1,000 mg by mouth 2 (two) times daily. 09/18/21   [provider]    Allergies: Patient has no known allergies.    Review of Systems  Gastrointestinal:  Positive for abdominal pain.    Updated Vital Signs BP 123/69 (BP Location: Right Arm)   Pulse 60   Temp (!) 97.3 F (36.3 C) (Oral)   Resp 18   LMP 11/11/2023 (Approximate)   SpO2 98%   Physical Exam Vitals and nursing note reviewed.  Constitutional:      General: She is not in acute distress. HENT:     Head:  Normocephalic and atraumatic.     Nose: Nose normal.  Eyes:     General: No scleral icterus. Cardiovascular:     Rate and Rhythm: Normal rate and regular rhythm.     Pulses: Normal pulses.     Heart sounds: Normal heart sounds.  Pulmonary:     Effort: Pulmonary effort is normal. No respiratory distress.     Breath sounds: No wheezing.  Abdominal:     Palpations: Abdomen is soft.     Tenderness: There is no abdominal tenderness.  Musculoskeletal:     Cervical back: Normal range of motion.     Right lower leg: No edema.     Left lower leg: No edema.  Skin:    General: Skin is warm and dry.     Capillary Refill: Capillary refill takes less than 2 seconds.  Neurological:     Mental Status: She is alert. Mental status is at baseline.     Comments: Alert and oriented to self, place, time and event.   Speech is fluent, clear without dysarthria or dysphasia.   Strength 5/5 in upper/lower extremities   Sensation intact in upper/lower extremities   Normal gait.  CN I not tested  CN II grossly intact visual fields bilaterally. Did not visualize posterior eye.  CN III, IV, VI PERRLA and EOMs intact bilaterally  CN V Intact sensation to sharp and light touch to the face  CN VII facial movements symmetric  CN VIII not tested  CN IX, X no uvula deviation, symmetric rise of soft palate  CN XI 5/5 SCM and trapezius strength bilaterally  CN XII Midline tongue protrusion, symmetric L/R movements     Psychiatric:        Mood and Affect: Mood normal.        Behavior: Behavior normal.     (all labs ordered are listed, but only abnormal results are displayed) Labs Reviewed  COMPREHENSIVE METABOLIC PANEL WITH GFR - Abnormal; Notable for the following components:      Result Value   Glucose, Bld 113 (*)    Creatinine, Ser 1.01 (*)    All other components within normal limits  CBC - Abnormal; Notable for the following components:   Hemoglobin 10.9 (*)    HCT 34.0 (*)    All other  components within normal limits  URINALYSIS, ROUTINE W REFLEX MICROSCOPIC - Abnormal; Notable for the following components:   APPearance HAZY (*)    All other components within normal limits  LIPID PANEL - Abnormal; Notable for the following components:   Triglycerides 171 (*)    All other components within normal limits  LIPASE, BLOOD    EKG: None  Radiology: CT Head Wo Contrast Result Date: 08/21/2024 EXAM: CT HEAD WITHOUT CONTRAST 08/21/2024 07:39:29 AM TECHNIQUE: CT of the head was performed without the administration of intravenous contrast. Automated exposure control, iterative reconstruction, and/or weight based adjustment of the mA/kV was utilized to reduce the radiation dose to as low as reasonably achievable. COMPARISON: CTA head and neck 12/24/2021. CLINICAL HISTORY: 53 year old female with new onset headache for 2 weeks, no head injury. Abdominal pain onset yesterday. FINDINGS: BRAIN AND VENTRICLES: No acute hemorrhage. No evidence of acute infarct. No hydrocephalus. No extra-axial collection. No mass effect or midline shift. Normal brain volume. Normal gray white differentiation. No suspicious intracranial vascular hyperdensity. Chronic partially empty sella nonspecific and unchanged . ORBITS: No acute abnormality. SINUSES: Chronic appearing left sphenoid sinus inflammation with mucoperiosteal thickening. Minor maxillary sinus mucosal thickening. Middle ears and mastoids are clear. SOFT TISSUES AND SKULL: No acute soft tissue abnormality. No skull fracture. IMPRESSION: 1. No acute intracranial abnormality. Electronically signed by: Helayne Hurst MD 08/21/2024 07:43 AM EDT RP Workstation: [id number]     Procedures   Medications Ordered in the ED  metoCLOPramide  (REGLAN ) tablet 10 mg (10 mg Oral Given 08/21/24 0720)  diphenhydrAMINE  (BENADRYL ) capsule 25 mg (25 mg Oral Given 08/21/24 0720)  ibuprofen  (ADVIL ) tablet 600 mg (600 mg Oral Given 08/21/24 0719)                                     Medical Decision Making Amount and/or Complexity of Data Reviewed Labs: ordered. Radiology: ordered.  Risk Prescription drug management.   This patient presents to the ED for concern of headache and abd pain, this involves a number of treatment options, and is a complaint that carries with it a moderate to high risk of complications and morbidity. A differential diagnosis was considered for the patient's symptoms which is discussed below:   Emergent considerations for headache include subarachnoid hemorrhage, meningitis, temporal arteritis, glaucoma, cerebral ischemia, carotid/vertebral dissection, intracranial tumor, Venous sinus thrombosis, carbon monoxide poisoning, acute or chronic subdural hemorrhage.  Other considerations include: Migraine, Cluster headache, Hypertension, Caffeine, alcohol, or drug withdrawal, Pseudotumor cerebri, Arteriovenous malformation, Head injury, Neurocysticercosis, Post-lumbar puncture, Preeclampsia, Tension headache, Sinusitis, Cervical arthritis, Refractive  error causing strain, Dental abscess, Otitis media, Temporomandibular joint syndrome, Depression, Somatoform disorder (eg, somatization) Trigeminal neuralgia, Glossopharyngeal neuralgia.  The causes of generalized abdominal pain include but are not limited to AAA, mesenteric ischemia, appendicitis, diverticulitis, DKA, gastritis, gastroenteritis, AMI, nephrolithiasis, pancreatitis, peritonitis, adrenal insufficiency,lead poisoning, iron toxicity, intestinal ischemia, constipation, UTI,SBO/LBO, splenic rupture, biliary disease, IBD, IBS, PUD, or hepatitis. Ectopic pregnancy, ovarian torsion, PID.    Co morbidities: Discussed in HPI   Brief History:  Patient is a 53 year old female with a past medical history significant for hypertension diabetes migraine  She presents emergency room today with complaints of epigastric abdominal pain since yesterday she denies any nausea vomiting or diarrhea  she denies any chest pain or difficulty breathing.  No fevers or chills negative frequency urgency dysuria hematuria.  She also had as that she has had a headache for 2 weeks although she indicates that this has been rare and intermittent bitemporal headache she states that today she has a headache that is more frontal achy without any numbness weakness slurred speech confusion she is not on any anticoagulation, no head injuries.  She is status post cholecystectomy years ago.    EMR reviewed including pt PMHx, past surgical history and past visits to ER.   See HPI for more details   Lab Tests:   I personally reviewed all laboratory work and imaging. Metabolic panel without any acute abnormality specifically kidney function within normal limits and no significant electrolyte abnormalities. CBC without leukocytosis or significant anemia.   Imaging Studies:  NAD. I personally reviewed all imaging studies and no acute abnormality found. I agree with radiology interpretation.    Cardiac Monitoring:  The patient was maintained on a cardiac monitor.  I personally viewed and interpreted the cardiac monitored which showed an underlying rhythm of: NSR NA   Medicines ordered:  I ordered medication including Reglan  Benadryl  ibuprofen  for headache Reevaluation of the patient after these medicines showed that the patient improved I have reviewed the patients home medicines and have made adjustments as needed   Critical Interventions:     Consults/Attending Physician      Reevaluation:  After the interventions noted above I re-evaluated patient and found that they have :improved   Social Determinants of Health:      Problem List / ED Course:  Patient with headache and abdominal pain.  She is well-appearing on my exam has normal neurologic exam.  Is tolerating p.o. given her Reglan  Benadryl  and ibuprofen  p.o. since she would prefer not to have an IV.  CT head unremarkable  and I recommend that she follow-up with neurology.    Dispostion:  After consideration of the diagnostic results and the patients response to treatment, I feel that the patent would benefit from outpatient follow up. Return precautions provided.    Final diagnoses:  Acute nonintractable headache, unspecified headache type  Epigastric pain    ED Discharge Orders          Ordered    famotidine (PEPCID) 20 MG tablet  2 times daily        08/21/24 0824               Neldon Hamp RAMAN, PA 08/21/24 1245    Tegeler, Lonni PARAS, MD 08/21/24 1539

## 2024-08-21 NOTE — ED Triage Notes (Signed)
 Patient reports pain across her abdomen onset yesterday , no emesis or diarrhea , also requesting lipid panel test , she adds headache for 2 weeks , no head injury .
